# Patient Record
Sex: Male | Born: 1950 | Race: White | Hispanic: No | Smoking: Never smoker
Health system: Southern US, Community
[De-identification: ages and names within clinical notes are randomized; demographics above are authoritative.]

## PROBLEM LIST (undated history)

## (undated) DIAGNOSIS — K635 Polyp of colon: Secondary | ICD-10-CM

## (undated) DIAGNOSIS — F419 Anxiety disorder, unspecified: Secondary | ICD-10-CM

## (undated) DIAGNOSIS — R319 Hematuria, unspecified: Secondary | ICD-10-CM

## (undated) DIAGNOSIS — K769 Liver disease, unspecified: Secondary | ICD-10-CM

## (undated) DIAGNOSIS — F1020 Alcohol dependence, uncomplicated: Secondary | ICD-10-CM

## (undated) DIAGNOSIS — H7290 Unspecified perforation of tympanic membrane, unspecified ear: Secondary | ICD-10-CM

## (undated) DIAGNOSIS — K6389 Other specified diseases of intestine: Secondary | ICD-10-CM

## (undated) HISTORY — PX: COLONOSCOPY: SHX174

---

## 1998-11-05 ENCOUNTER — Encounter: Admission: RE | Admit: 1998-11-05 | Discharge: 1999-02-03 | Payer: Self-pay | Admitting: Anesthesiology

## 2015-08-06 DIAGNOSIS — Z13220 Encounter for screening for lipoid disorders: Secondary | ICD-10-CM | POA: Diagnosis not present

## 2015-08-06 DIAGNOSIS — F410 Panic disorder [episodic paroxysmal anxiety] without agoraphobia: Secondary | ICD-10-CM | POA: Diagnosis not present

## 2015-08-06 DIAGNOSIS — Z131 Encounter for screening for diabetes mellitus: Secondary | ICD-10-CM | POA: Diagnosis not present

## 2015-08-06 DIAGNOSIS — G479 Sleep disorder, unspecified: Secondary | ICD-10-CM | POA: Diagnosis not present

## 2015-08-06 DIAGNOSIS — N529 Male erectile dysfunction, unspecified: Secondary | ICD-10-CM | POA: Diagnosis not present

## 2016-01-16 DIAGNOSIS — F410 Panic disorder [episodic paroxysmal anxiety] without agoraphobia: Secondary | ICD-10-CM | POA: Diagnosis not present

## 2016-01-16 DIAGNOSIS — G479 Sleep disorder, unspecified: Secondary | ICD-10-CM | POA: Diagnosis not present

## 2016-01-16 DIAGNOSIS — Z1211 Encounter for screening for malignant neoplasm of colon: Secondary | ICD-10-CM | POA: Diagnosis not present

## 2016-07-17 DIAGNOSIS — M79643 Pain in unspecified hand: Secondary | ICD-10-CM | POA: Diagnosis not present

## 2016-07-17 DIAGNOSIS — G479 Sleep disorder, unspecified: Secondary | ICD-10-CM | POA: Diagnosis not present

## 2016-07-17 DIAGNOSIS — F419 Anxiety disorder, unspecified: Secondary | ICD-10-CM | POA: Diagnosis not present

## 2018-01-21 DIAGNOSIS — Z125 Encounter for screening for malignant neoplasm of prostate: Secondary | ICD-10-CM | POA: Diagnosis not present

## 2018-01-21 DIAGNOSIS — R972 Elevated prostate specific antigen [PSA]: Secondary | ICD-10-CM | POA: Diagnosis not present

## 2018-01-21 DIAGNOSIS — F410 Panic disorder [episodic paroxysmal anxiety] without agoraphobia: Secondary | ICD-10-CM | POA: Diagnosis not present

## 2018-01-21 DIAGNOSIS — G479 Sleep disorder, unspecified: Secondary | ICD-10-CM | POA: Diagnosis not present

## 2018-01-21 DIAGNOSIS — E785 Hyperlipidemia, unspecified: Secondary | ICD-10-CM | POA: Diagnosis not present

## 2018-01-21 DIAGNOSIS — Z79899 Other long term (current) drug therapy: Secondary | ICD-10-CM | POA: Diagnosis not present

## 2018-02-25 DIAGNOSIS — R972 Elevated prostate specific antigen [PSA]: Secondary | ICD-10-CM | POA: Diagnosis not present

## 2018-07-19 DIAGNOSIS — F411 Generalized anxiety disorder: Secondary | ICD-10-CM | POA: Diagnosis not present

## 2018-07-19 DIAGNOSIS — R972 Elevated prostate specific antigen [PSA]: Secondary | ICD-10-CM | POA: Diagnosis not present

## 2018-07-19 DIAGNOSIS — G479 Sleep disorder, unspecified: Secondary | ICD-10-CM | POA: Diagnosis not present

## 2018-07-19 DIAGNOSIS — Z0000 Encounter for general adult medical examination without abnormal findings: Secondary | ICD-10-CM | POA: Diagnosis not present

## 2018-07-19 DIAGNOSIS — Z1211 Encounter for screening for malignant neoplasm of colon: Secondary | ICD-10-CM | POA: Diagnosis not present

## 2018-07-19 DIAGNOSIS — H01002 Unspecified blepharitis right lower eyelid: Secondary | ICD-10-CM | POA: Diagnosis not present

## 2018-07-19 DIAGNOSIS — E785 Hyperlipidemia, unspecified: Secondary | ICD-10-CM | POA: Diagnosis not present

## 2018-07-26 DIAGNOSIS — Z0000 Encounter for general adult medical examination without abnormal findings: Secondary | ICD-10-CM | POA: Diagnosis not present

## 2018-07-26 DIAGNOSIS — E785 Hyperlipidemia, unspecified: Secondary | ICD-10-CM | POA: Diagnosis not present

## 2018-07-26 DIAGNOSIS — R972 Elevated prostate specific antigen [PSA]: Secondary | ICD-10-CM | POA: Diagnosis not present

## 2018-09-09 DIAGNOSIS — R361 Hematospermia: Secondary | ICD-10-CM | POA: Diagnosis not present

## 2018-09-09 DIAGNOSIS — R319 Hematuria, unspecified: Secondary | ICD-10-CM | POA: Diagnosis not present

## 2018-09-23 DIAGNOSIS — R319 Hematuria, unspecified: Secondary | ICD-10-CM | POA: Diagnosis not present

## 2018-10-03 DIAGNOSIS — R8289 Other abnormal findings on cytological and histological examination of urine: Secondary | ICD-10-CM | POA: Diagnosis not present

## 2018-10-03 DIAGNOSIS — R310 Gross hematuria: Secondary | ICD-10-CM | POA: Diagnosis not present

## 2018-10-03 DIAGNOSIS — R972 Elevated prostate specific antigen [PSA]: Secondary | ICD-10-CM | POA: Diagnosis not present

## 2018-10-04 DIAGNOSIS — L570 Actinic keratosis: Secondary | ICD-10-CM | POA: Diagnosis not present

## 2018-10-04 DIAGNOSIS — C4402 Squamous cell carcinoma of skin of lip: Secondary | ICD-10-CM | POA: Diagnosis not present

## 2018-10-04 DIAGNOSIS — C44319 Basal cell carcinoma of skin of other parts of face: Secondary | ICD-10-CM | POA: Diagnosis not present

## 2018-10-04 DIAGNOSIS — D485 Neoplasm of uncertain behavior of skin: Secondary | ICD-10-CM | POA: Diagnosis not present

## 2018-10-12 DIAGNOSIS — K769 Liver disease, unspecified: Secondary | ICD-10-CM | POA: Diagnosis not present

## 2018-10-12 DIAGNOSIS — K5730 Diverticulosis of large intestine without perforation or abscess without bleeding: Secondary | ICD-10-CM | POA: Diagnosis not present

## 2018-10-12 DIAGNOSIS — R932 Abnormal findings on diagnostic imaging of liver and biliary tract: Secondary | ICD-10-CM | POA: Diagnosis not present

## 2018-10-12 DIAGNOSIS — R319 Hematuria, unspecified: Secondary | ICD-10-CM | POA: Diagnosis not present

## 2018-10-12 DIAGNOSIS — K6389 Other specified diseases of intestine: Secondary | ICD-10-CM | POA: Diagnosis not present

## 2018-10-12 DIAGNOSIS — N400 Enlarged prostate without lower urinary tract symptoms: Secondary | ICD-10-CM | POA: Diagnosis not present

## 2018-10-12 DIAGNOSIS — N3289 Other specified disorders of bladder: Secondary | ICD-10-CM | POA: Diagnosis not present

## 2018-10-17 DIAGNOSIS — Z23 Encounter for immunization: Secondary | ICD-10-CM | POA: Diagnosis not present

## 2018-10-25 DIAGNOSIS — D124 Benign neoplasm of descending colon: Secondary | ICD-10-CM | POA: Diagnosis not present

## 2018-10-25 DIAGNOSIS — D122 Benign neoplasm of ascending colon: Secondary | ICD-10-CM | POA: Diagnosis not present

## 2018-10-25 DIAGNOSIS — D123 Benign neoplasm of transverse colon: Secondary | ICD-10-CM | POA: Diagnosis not present

## 2018-10-25 DIAGNOSIS — Z1211 Encounter for screening for malignant neoplasm of colon: Secondary | ICD-10-CM | POA: Diagnosis not present

## 2018-10-31 DIAGNOSIS — D2330 Other benign neoplasm of skin of unspecified part of face: Secondary | ICD-10-CM | POA: Diagnosis not present

## 2018-10-31 DIAGNOSIS — C44319 Basal cell carcinoma of skin of other parts of face: Secondary | ICD-10-CM | POA: Diagnosis not present

## 2018-11-01 DIAGNOSIS — Z1211 Encounter for screening for malignant neoplasm of colon: Secondary | ICD-10-CM | POA: Diagnosis not present

## 2018-11-01 DIAGNOSIS — D122 Benign neoplasm of ascending colon: Secondary | ICD-10-CM | POA: Diagnosis not present

## 2018-11-01 DIAGNOSIS — D123 Benign neoplasm of transverse colon: Secondary | ICD-10-CM | POA: Diagnosis not present

## 2018-11-01 DIAGNOSIS — D124 Benign neoplasm of descending colon: Secondary | ICD-10-CM | POA: Diagnosis not present

## 2018-11-07 ENCOUNTER — Other Ambulatory Visit: Payer: Self-pay | Admitting: Gastroenterology

## 2018-11-07 DIAGNOSIS — R935 Abnormal findings on diagnostic imaging of other abdominal regions, including retroperitoneum: Secondary | ICD-10-CM

## 2018-11-08 ENCOUNTER — Other Ambulatory Visit: Payer: Self-pay | Admitting: Gastroenterology

## 2018-11-08 DIAGNOSIS — K769 Liver disease, unspecified: Secondary | ICD-10-CM

## 2018-11-08 DIAGNOSIS — R935 Abnormal findings on diagnostic imaging of other abdominal regions, including retroperitoneum: Secondary | ICD-10-CM

## 2018-11-13 ENCOUNTER — Ambulatory Visit
Admission: RE | Admit: 2018-11-13 | Discharge: 2018-11-13 | Disposition: A | Discharge status: Home / Self Care | Payer: Medicare HMO | Source: Ambulatory Visit | Attending: Gastroenterology | Admitting: Gastroenterology

## 2018-11-13 DIAGNOSIS — K769 Liver disease, unspecified: Secondary | ICD-10-CM | POA: Diagnosis not present

## 2018-11-13 DIAGNOSIS — R935 Abnormal findings on diagnostic imaging of other abdominal regions, including retroperitoneum: Secondary | ICD-10-CM

## 2018-11-13 MED ORDER — GADOBENATE DIMEGLUMINE 529 MG/ML IV SOLN
20.0000 mL | Freq: Once | INTRAVENOUS | Status: AC | PRN
  Administered 2018-11-13: 20 mL via INTRAVENOUS

## 2018-11-17 DIAGNOSIS — C4402 Squamous cell carcinoma of skin of lip: Secondary | ICD-10-CM | POA: Diagnosis not present

## 2018-12-02 DIAGNOSIS — D123 Benign neoplasm of transverse colon: Secondary | ICD-10-CM | POA: Diagnosis not present

## 2018-12-02 DIAGNOSIS — R3129 Other microscopic hematuria: Secondary | ICD-10-CM | POA: Diagnosis not present

## 2018-12-06 ENCOUNTER — Other Ambulatory Visit: Payer: Self-pay | Admitting: Surgery

## 2018-12-15 ENCOUNTER — Other Ambulatory Visit: Payer: Self-pay

## 2018-12-15 ENCOUNTER — Encounter (HOSPITAL_COMMUNITY)
Admission: RE | Admit: 2018-12-15 | Discharge: 2018-12-15 | Disposition: A | Discharge status: Home / Self Care | Payer: Medicare HMO | Source: Ambulatory Visit | Attending: Surgery | Admitting: Surgery

## 2018-12-15 ENCOUNTER — Encounter (HOSPITAL_COMMUNITY): Payer: Self-pay

## 2018-12-15 DIAGNOSIS — Z01812 Encounter for preprocedural laboratory examination: Secondary | ICD-10-CM | POA: Diagnosis not present

## 2018-12-15 HISTORY — DX: Anxiety disorder, unspecified: F41.9

## 2018-12-15 HISTORY — DX: Liver disease, unspecified: K76.9

## 2018-12-15 HISTORY — DX: Polyp of colon: K63.5

## 2018-12-15 HISTORY — DX: Hematuria, unspecified: R31.9

## 2018-12-15 HISTORY — DX: Unspecified perforation of tympanic membrane, unspecified ear: H72.90

## 2018-12-15 HISTORY — DX: Other specified diseases of intestine: K63.89

## 2018-12-15 HISTORY — DX: Alcohol dependence, uncomplicated: F10.20

## 2018-12-15 LAB — COMPREHENSIVE METABOLIC PANEL
ALT: 10 U/L (ref 0–44)
AST: 12 U/L — ABNORMAL LOW (ref 15–41)
Albumin: 4.3 g/dL (ref 3.5–5.0)
Alkaline Phosphatase: 48 U/L (ref 38–126)
Anion gap: 8 (ref 5–15)
BUN: 15 mg/dL (ref 8–23)
CO2: 24 mmol/L (ref 22–32)
Calcium: 9.2 mg/dL (ref 8.9–10.3)
Chloride: 106 mmol/L (ref 98–111)
Creatinine, Ser: 0.96 mg/dL (ref 0.61–1.24)
GFR calc Af Amer: >60 mL/min (ref >60)
GFR calc non Af Amer: >60 mL/min (ref >60)
Glucose, Bld: 87 mg/dL (ref 70–99)
POTASSIUM: 4.2 mmol/L (ref 3.5–5.1)
Sodium: 138 mmol/L (ref 135–145)
TOTAL PROTEIN: 7.5 g/dL (ref 6.5–8.1)
Total Bilirubin: 0.7 mg/dL (ref 0.3–1.2)

## 2018-12-15 LAB — CBC WITH DIFFERENTIAL/PLATELET
ABS IMMATURE GRANULOCYTES: 0.02 10*3/uL (ref 0.00–0.07)
Basophils Absolute: 0 10*3/uL (ref 0.0–0.1)
Basophils Relative: 1 %
Eosinophils Absolute: 0.2 10*3/uL (ref 0.0–0.5)
Eosinophils Relative: 3 %
HCT: 44.6 % (ref 39.0–52.0)
HEMOGLOBIN: 14.7 g/dL (ref 13.0–17.0)
Immature Granulocytes: 0 %
Lymphocytes Relative: 37 %
Lymphs Abs: 2.1 10*3/uL (ref 0.7–4.0)
MCH: 31.2 pg (ref 26.0–34.0)
MCHC: 33 g/dL (ref 30.0–36.0)
MCV: 94.7 fL (ref 80.0–100.0)
Monocytes Absolute: 0.3 10*3/uL (ref 0.1–1.0)
Monocytes Relative: 6 %
NEUTROS ABS: 3 10*3/uL (ref 1.7–7.7)
Neutrophils Relative %: 53 %
Platelets: 219 10*3/uL (ref 150–400)
RBC: 4.71 MIL/uL (ref 4.22–5.81)
RDW: 12.7 % (ref 11.5–15.5)
WBC: 5.7 10*3/uL (ref 4.0–10.5)
nRBC: 0 % (ref 0.0–0.2)

## 2018-12-15 LAB — ABO/RH: ABO/RH(D): O NEG

## 2018-12-15 LAB — HEMOGLOBIN A1C
Hgb A1c MFr Bld: 5.3 % (ref 4.8–5.6)
Mean Plasma Glucose: 105.41 mg/dL

## 2018-12-15 NOTE — Patient Instructions (Signed)
Jack Yu  12/15/2018   Your procedure is scheduled on: 12-20-2018    Report to [facility] Main  Entrance     Report to admitting at 5:30AM    Call this number if you have problems the morning of surgery [phone]      Remember: [place]!   [place] 2 PRESURGERY ENSURE DRINKS THE NIGHT BEFORE SURGERY AT 10: 00 PM. NO SOLIDS AFTER MIDNIGHT THE DAY PRIOR TO THE SURGERY CONTINUE DRINKING CLEAR LIQUIDS UNTIL THREE HOURS PRIOR TO SCHEDULED SURGERY.  PLEASE DRINK LAST PRESURGERY ENSURE DRINK PER SURGEON ORDER AT ___4:30AM____.   BRUSH YOUR TEETH MORNING OF SURGERY AND RINSE YOUR MOUTH OUT, NO CHEWING GUM CANDY OR MINTS.      CLEAR LIQUID DIET   Foods Allowed                                                                     Foods Excluded  Coffee and tea, regular and decaf                             liquids that you cannot  Plain Jell-O in any flavor                                             see through such as: Fruit ices (not with fruit pulp)                                     milk, soups, orange juice  Iced Popsicles                                    All solid food Carbonated beverages, regular and diet                                    Cranberry, grape and apple juices Sports drinks like Gatorade Lightly seasoned clear broth or consume(fat free) Sugar, honey syrup  Sample Menu Breakfast                                Lunch                                     Supper Cranberry juice                    Beef broth                            Chicken broth Jell-O  Grape juice                           Apple juice Coffee or tea                        Jell-O                                      Popsicle                                                Coffee or tea                        Coffee or  tea  _____________________________________________________________________       Take these medicines the morning of surgery with A SIP OF WATER: XANAX IF NEEDED                                You may not have any metal on your body including hair pins and              piercings  Do not wear jewelry, make-up, lotions, powders or perfumes, deodorant                      Men may shave face and neck.   Do not bring valuables to the hospital. [place].  Contacts, dentures or bridgework may not be worn into surgery.  Leave suitcase in the car. After surgery it may be brought to your room.                   Please read over the following fact sheets you were given: _____________________________________________________________________             [facility] - Preparing for Surgery Before surgery, you can play an important role.  Because skin is not sterile, your skin needs to be as free of germs as possible.  You can reduce the number of germs on your skin by washing with CHG (chlorahexidine gluconate) soap before surgery.  CHG is an antiseptic cleaner which kills germs and bonds with the skin to continue killing germs even after washing. Please DO NOT use if you have an allergy to CHG or antibacterial soaps.  If your skin becomes reddened/irritated stop using the CHG and inform your nurse when you arrive at Short Stay. Do not shave (including legs and underarms) for at least 48 hours prior to the first CHG shower.  You may shave your face/neck. Please follow these instructions carefully:  1.  Shower with CHG Soap the night before surgery and the  morning of Surgery.  2.  If you choose to wash your hair, wash your hair first as usual with your  normal  shampoo.  3.  After you shampoo, rinse your hair and body thoroughly to remove the  shampoo.  4.  Use CHG as you would any other liquid soap.  You can apply chg  directly  to the skin and wash                       Gently with a scrungie or clean washcloth.  5.  Apply the CHG Soap to your body ONLY FROM THE NECK DOWN.   Do not use on face/ open                           Wound or open sores. Avoid contact with eyes, ears mouth and genitals (private parts).                       Wash face,  Genitals (private parts) with your normal soap.             6.  Wash thoroughly, paying special attention to the area where your surgery  will be performed.  7.  Thoroughly rinse your body with warm water from the neck down.  8.  DO NOT shower/wash with your normal soap after using and rinsing off  the CHG Soap.                9.  Pat yourself dry with a clean towel.            10.  Wear clean pajamas.            11.  Place clean sheets on your bed the night of your first shower and do not  sleep with pets. Day of Surgery : Do not apply any lotions/deodorants the morning of surgery.  Please wear clean clothes to the hospital/surgery center.  FAILURE TO FOLLOW THESE INSTRUCTIONS MAY RESULT IN THE CANCELLATION OF YOUR SURGERY PATIENT SIGNATURE_________________________________  NURSE SIGNATURE__________________________________  ________________________________________________________________________   Jack Yu  An incentive spirometer is a tool that can help keep your lungs clear and active. This tool measures how well you are filling your lungs with each breath. Taking long deep breaths may help reverse or decrease the chance of developing breathing (pulmonary) problems (especially infection) following:  A long period of time when you are unable to move or be active. BEFORE THE PROCEDURE   If the spirometer includes an indicator to show your best effort, your nurse or respiratory therapist will set it to a desired goal.  If possible, sit up straight or lean slightly forward. Try not to slouch.  Hold the incentive spirometer in an upright  position. INSTRUCTIONS FOR USE  1. Sit on the edge of your bed if possible, or sit up as far as you can in bed or on a chair. 2. Hold the incentive spirometer in an upright position. 3. Breathe out normally. 4. Place the mouthpiece in your mouth and seal your lips tightly around it. 5. Breathe in slowly and as deeply as possible, raising the piston or the ball toward the top of the column. 6. Hold your breath for 3-5 seconds or for as long as possible. Allow the piston or ball to fall to the bottom of the column. 7. Remove the mouthpiece from your mouth and breathe out normally. 8. Rest for a few seconds and repeat Steps 1 through 7 at least 10 times every 1-2 hours when you are awake. Take your time and take a few normal breaths between deep breaths. 9. The spirometer may include an indicator to  show your best effort. Use the indicator as a goal to work toward during each repetition. 10. After each set of 10 deep breaths, practice coughing to be sure your lungs are clear. If you have an incision (the cut made at the time of surgery), support your incision when coughing by placing a pillow or rolled up towels firmly against it. Once you are able to get out of bed, walk around indoors and cough well. You may stop using the incentive spirometer when instructed by your caregiver.  RISKS AND COMPLICATIONS  Take your time so you do not get dizzy or light-headed.  If you are in pain, you may need to take or ask for pain medication before doing incentive spirometry. It is harder to take a deep breath if you are having pain. AFTER USE  Rest and breathe slowly and easily.  It can be helpful to keep track of a log of your progress. Your caregiver can provide you with a simple table to help with this. If you are using the spirometer at home, follow these instructions: [place] IF:   You are having difficultly using the spirometer.  You have trouble using the spirometer as often as  instructed.  Your pain medication is not giving enough relief while using the spirometer.  You develop fever of 100.5 F (38.1 C) or higher. SEEK IMMEDIATE MEDICAL CARE IF:   You cough up bloody sputum that had not been present before.  You develop fever of 102 F (38.9 C) or greater.  You develop worsening pain at or near the incision site. MAKE SURE YOU:   Understand these instructions.  Will watch your condition.  Will get help right away if you are not doing well or get worse. Document Released: 03/29/2007 Document Revised: 02/08/2012 Document Reviewed: 05/30/2007 [facility] Patient Information 2014 [place], [place].   ________________________________________________________________________

## 2018-12-15 NOTE — Consult Note (Signed)
[place] Nurse requested for preoperative stoma site marking  Discussed surgical procedure and stoma creation with patient and family.  Explained role of the [place] nurse team.  Provided the patient with educational booklet and provided samples of pouching options.  Answered patient and family questions.   Examined patient sitting, and standing in order to place the marking in the patient's visual field, away from any creases or abdominal contour issues and within the rectus muscle.  Attempted to mark below the patient's belt line.   Marked for colostomy in the LLQ  4 cm to the left of the umbilicus and 3 cm below the umbilicus.   Patient's abdomen cleansed with CHG wipes at site markings, allowed to air dry prior to marking.Covered mark with thin film transparent dressing to preserve mark until date of surgery.   [place] Nurse team will follow up with patient after surgery for continue ostomy care and teaching.   Domenic Moras MSN, RN, FNP-BC CWON Wound, Ostomy, Continence Nurse Pager [phone]

## 2018-12-16 LAB — CEA: CEA: 1.8 ng/mL (ref 0.0–4.7)

## 2018-12-19 MED ORDER — BUPIVACAINE LIPOSOME 1.3 % IJ SUSP
20.0000 mL | Freq: Once | INTRAMUSCULAR | Status: DC
  Filled 2018-12-19: qty 20

## 2018-12-19 NOTE — Anesthesia Preprocedure Evaluation (Addendum)
Anesthesia Evaluation  Patient identified by MRN, date of birth, ID band Patient awake    Reviewed: Allergy & Precautions, H&P , NPO status , Patient's Chart, lab work & pertinent test results, reviewed documented beta blocker date and time   Airway Mallampati: II  TM Distance: >3 FB Neck ROM: full    Dental no notable dental hx.    Pulmonary neg pulmonary ROS,    Pulmonary exam normal breath sounds clear to auscultation       Cardiovascular Exercise Tolerance: Good negative cardio ROS   Rhythm:regular Rate:Normal     Neuro/Psych negative neurological ROS  negative psych ROS   GI/Hepatic negative GI ROS, Neg liver ROS,   Endo/Other  negative endocrine ROS  Renal/GU negative Renal ROS  negative genitourinary   Musculoskeletal   Abdominal   Peds  Hematology negative hematology ROS (+)   Anesthesia Other Findings   Reproductive/Obstetrics negative OB ROS                            Anesthesia Physical Anesthesia Plan  ASA: II  Anesthesia Plan: General   Post-op Pain Management:    Induction: Intravenous  PONV Risk Score and Plan: 2 and Ondansetron and Treatment may vary due to age or medical condition  Airway Management Planned: Oral ETT  Additional Equipment:   Intra-op Plan:   Post-operative Plan: Extubation in OR  Informed Consent: I have reviewed the patients History and Physical, chart, labs and discussed the procedure including the risks, benefits and alternatives for the proposed anesthesia with the patient or authorized representative who has indicated his/her understanding and acceptance.     Dental Advisory Given  Plan Discussed with: CRNA, Anesthesiologist and Surgeon  Anesthesia Plan Comments: (  )       Anesthesia Quick Evaluation

## 2018-12-19 NOTE — H&P (Signed)
Jack Yu Documented: 12/02/2018 9:31 AM Location: [facility] Patient #: [id number] DOB: [birth year] Single / Language: Cleophus Molt / Race: White Male   History of Present Illness   The patient is a 68 year old male who presents with a complaint of colon mass.  The PCP is Dr. Charlynne Cousins  The patient was referred by Dr. Alycia Rossetti. Dr. Oletta Lamas spoke to me about this patient in the middle of December 2019.  He comes with his neice, Velna Hatchet (who used to work for [place] - left about 6 years ago)  The patient is not exactly clear for his timeline. It seems like this all started when he noted blood in his urine. He had a CT scan at [facility] on 12 October 2018. This showed 1. Decompressed urinary bladder with mild thickening, 2. Diverticulosis without diverticulitis, 3. Asymmetric prominent masslike appearance of the mid transverse colon, 4. Probable small hemangioma in the left hepatic lobe. He underwent a colonoscopy by Dr. Alycia Rossetti on 10/25/2018 - who found three 5 - 10 mm polyps of the right colon, 4 5 - 15 mm polyps of the transverse colon, tumor mid transverse colon (tatooed), one 15 mm polyp of the descending colon. 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 He underwent an abdominal MRI on 11/13/2018 which showed 1. Small lesion in the medial segment of the left hepatic lobe demonstrates signal and enhancement characteristics most consistent with an incidental flash filling hemangioma. No suspicious hepatic lesions. 2. Mass lesion of the transverse colon (4.3 cm) as reported on outside CT, remaining worrisome for colon cancer. Colonoscopy recommended. 3. No evidence of metastatic disease on this examination   I reviewed with the patient  the findings and need for colon surgery. I discussed both surgical and non surgical options. I discussed the role of laparoscopic and open surgery in colon surgery. I reviewed the risks of surgery, including, but not limited to, infection, bleeding, nerve injury, anastomotic leaks, and possibility of colostomy. I went over the preoperative mechanical and antibiotic bowel prep for colon surgery and provided prescriptions for these.  I provided the patient literature on colon surgery. I tried to answer all questions for the patient.  I think the biggest question is the extent of colon surgery required.  [I spoke to Dr. Oletta Lamas - it looks like an extended right colectomy (that includes the main tumor) will take care of all suspicious polyps. Of course, he would need a colonoscopy in 1 year]  Plan: 1) Extended right colectomy, 3) mechanical and anitbiotic bowel prep given  Review of Systems as stated in this history (HPI) or in the review of systems. Otherwise all other 12 point ROS are negative  Past Medical History: 1. Hematuria Being evaluated at [place], [place]  Social History: Divorced. He has one son, 26 yo, who lives in [place]. 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  He comes with his neice, Sondra Barges (?) (who used to work for [place] - left about 6 years ago). She nows works at the jail [place].   Past Surgical History (April Staton, [place]; 12/02/2018 9:31 AM) Colon Polyp Removal - Colonoscopy   Diagnostic Studies History (April Staton, [place]; 12/02/2018 9:31 AM) Colonoscopy  within last year  Allergies (April Staton, CMA; 12/02/2018 9:32 AM) No  Known Drug Allergies [12/02/2018]:  Medication History (April Staton, CMA; 12/02/2018 9:33 AM) Temazepam (15MG  Capsule, Oral) Active. ALPRAZolam (1MG  Tablet, Oral) Active. Medications Reconciled  Social History (April Staton, [place]; 12/02/2018 9:31 AM) Alcohol use  Moderate  alcohol use. No caffeine use  No drug use  Tobacco use  Never smoker.  Family History (April Staton, [place]; 12/02/2018 9:31 AM) Alcohol Abuse  Brother, Father. Arthritis  Father, Mother. Breast Cancer  Family Members In General. Cancer  Brother. Colon Polyps  Father, Sister. Heart Disease  Father, Mother. Melanoma  Family Members In General.  Other Problems (April Staton, [place]; 12/02/2018 9:31 AM) Alcohol Abuse  Anxiety Disorder  Arthritis  Enlarged Prostate  Melanoma     Review of Systems (April Staton CMA; 12/02/2018 9:31 AM) General Not Present- Appetite Loss, Chills, Fatigue, Fever, Night Sweats, Weight Gain and Weight Loss. Skin Not Present- Change in Wart/Mole, Dryness, Hives, Jaundice, New Lesions, Non-Healing Wounds, Rash and Ulcer. HEENT Present- Seasonal Allergies. Not Present- Earache, Hearing Loss, Hoarseness, Nose Bleed, Oral Ulcers, Ringing in the Ears, Sinus Pain, Sore Throat, Visual Disturbances, Wears glasses/contact lenses and Yellow Eyes. Respiratory Present- Snoring. Not Present- Bloody sputum, Chronic Cough, Difficulty Breathing and Wheezing. Cardiovascular Not Present- Chest Pain, Difficulty Breathing Lying Down, Leg Cramps, Palpitations, Rapid Heart Rate, Shortness of Breath and Swelling of Extremities. Gastrointestinal Not Present- Abdominal Pain, Bloating, Bloody Stool, Change in Bowel Habits, Chronic diarrhea, Constipation, Difficulty Swallowing, Excessive gas, Gets full quickly at meals, Hemorrhoids, Indigestion, Nausea, Rectal Pain and Vomiting. Male Genitourinary Present- Blood in Urine. Not Present- Change in Urinary Stream, Frequency, Impotence, Nocturia, Painful Urination, Urgency and Urine Leakage. Musculoskeletal Not Present- Back Pain, Joint Pain, Joint Stiffness, Muscle Pain, Muscle Weakness and Swelling of Extremities. Neurological Present- Tremor. Not Present- Decreased Memory, Fainting, Headaches, Numbness, Seizures, Tingling, Trouble  walking and Weakness. Psychiatric Present- Anxiety. Not Present- Bipolar, Change in Sleep Pattern, Depression, Fearful and Frequent crying. Endocrine Not Present- Cold Intolerance, Excessive Hunger, Hair Changes, Heat Intolerance, Hot flashes and New Diabetes. Hematology Not Present- Blood Thinners, Easy Bruising, Excessive bleeding, Gland problems, HIV and Persistent Infections.  Vitals (April Staton CMA; 12/02/2018 9:33 AM) 12/02/2018 9:33 AM Weight: 218 lb Height: 74in Body Surface Area: 2.25 m Body Mass Index: 27.99 kg/m  Temp.: 97.52F(Oral)  Pulse: 59 (Regular)  BP: 128/84 (Sitting, Left Arm, Standard)   Physical Exam  General: WN WM who is alert and generally healthy appearing. He is somewhat nervous. Skin: Inspection and palpation of the skin unremarkable.  Eyes: Conjunctivae white, pupils equal. Face, ears, nose, mouth, and throat: Face - normal. Normal ears and nose. Lips and teeth normal.  Neck: Supple. No mass. Trachea midline. No thyroid mass.  Lymph Nodes: No supraclavicular or cervical adenopathy. No axillary adenopathy.  Lungs: Normal respiratory effort. Clear to auscultation and symmetric breath sounds. Cardiovascular: Regular rate and rythm. Normal auscultation of the heart. No murmur or rub.  Abdomen: Soft. No mass. Liver and spleen not palpable. No tenderness. No hernia. Normal bowel sounds. No abdominal scars. Rectal: Not done.  Musculoskeletal/extremities: Normal gait. Good strength and ROM in upper and lower extremities.   Neurologic: Grossly intact to motor and sensory function.   Psychiatric: Has normal mood and affect. Judgement and insight appear normal.   Assessment & Plan  1.  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  HEMATURIA, MICROSCOPIC  (R31.29)  Impression: Being evaluated in  Spero Geralds, MD, [facility] Surgery Pager: [phone] Office phone:  [phone]

## 2018-12-20 ENCOUNTER — Encounter (HOSPITAL_COMMUNITY): Payer: Self-pay

## 2018-12-20 ENCOUNTER — Inpatient Hospital Stay (HOSPITAL_COMMUNITY): Payer: Medicare HMO | Admitting: Anesthesiology

## 2018-12-20 ENCOUNTER — Inpatient Hospital Stay (HOSPITAL_COMMUNITY): Payer: Medicare HMO | Admitting: Physician Assistant

## 2018-12-20 ENCOUNTER — Other Ambulatory Visit: Payer: Self-pay

## 2018-12-20 ENCOUNTER — Encounter (HOSPITAL_COMMUNITY)
Admission: RE | Disposition: A | Discharge status: Home / Self Care | Payer: Self-pay | Source: Home / Self Care | Attending: Surgery

## 2018-12-20 ENCOUNTER — Inpatient Hospital Stay (HOSPITAL_COMMUNITY)
Admission: RE | Admit: 2018-12-20 | Discharge: 2019-01-02 | DRG: 330 | Disposition: A | Discharge status: Home / Self Care | Payer: Medicare HMO | Attending: Surgery | Admitting: Surgery

## 2018-12-20 DIAGNOSIS — K567 Ileus, unspecified: Secondary | ICD-10-CM | POA: Diagnosis not present

## 2018-12-20 DIAGNOSIS — R251 Tremor, unspecified: Secondary | ICD-10-CM | POA: Diagnosis present

## 2018-12-20 DIAGNOSIS — Z79899 Other long term (current) drug therapy: Secondary | ICD-10-CM | POA: Diagnosis not present

## 2018-12-20 DIAGNOSIS — Z808 Family history of malignant neoplasm of other organs or systems: Secondary | ICD-10-CM

## 2018-12-20 DIAGNOSIS — D123 Benign neoplasm of transverse colon: Principal | ICD-10-CM | POA: Diagnosis present

## 2018-12-20 DIAGNOSIS — E440 Moderate protein-calorie malnutrition: Secondary | ICD-10-CM | POA: Diagnosis present

## 2018-12-20 DIAGNOSIS — Z85820 Personal history of malignant melanoma of skin: Secondary | ICD-10-CM

## 2018-12-20 DIAGNOSIS — K56609 Unspecified intestinal obstruction, unspecified as to partial versus complete obstruction: Secondary | ICD-10-CM | POA: Diagnosis not present

## 2018-12-20 DIAGNOSIS — Z8261 Family history of arthritis: Secondary | ICD-10-CM | POA: Diagnosis not present

## 2018-12-20 DIAGNOSIS — M1990 Unspecified osteoarthritis, unspecified site: Secondary | ICD-10-CM | POA: Diagnosis present

## 2018-12-20 DIAGNOSIS — Z6825 Body mass index (BMI) 25.0-25.9, adult: Secondary | ICD-10-CM | POA: Diagnosis not present

## 2018-12-20 DIAGNOSIS — F419 Anxiety disorder, unspecified: Secondary | ICD-10-CM | POA: Diagnosis present

## 2018-12-20 DIAGNOSIS — K9189 Other postprocedural complications and disorders of digestive system: Secondary | ICD-10-CM

## 2018-12-20 DIAGNOSIS — D1809 Hemangioma of other sites: Secondary | ICD-10-CM | POA: Diagnosis not present

## 2018-12-20 DIAGNOSIS — R05 Cough: Secondary | ICD-10-CM | POA: Diagnosis not present

## 2018-12-20 DIAGNOSIS — K635 Polyp of colon: Secondary | ICD-10-CM | POA: Diagnosis not present

## 2018-12-20 DIAGNOSIS — N400 Enlarged prostate without lower urinary tract symptoms: Secondary | ICD-10-CM | POA: Diagnosis not present

## 2018-12-20 DIAGNOSIS — R3129 Other microscopic hematuria: Secondary | ICD-10-CM | POA: Diagnosis not present

## 2018-12-20 DIAGNOSIS — D490 Neoplasm of unspecified behavior of digestive system: Secondary | ICD-10-CM | POA: Diagnosis not present

## 2018-12-20 DIAGNOSIS — R0602 Shortness of breath: Secondary | ICD-10-CM

## 2018-12-20 DIAGNOSIS — R140 Abdominal distension (gaseous): Secondary | ICD-10-CM | POA: Diagnosis not present

## 2018-12-20 DIAGNOSIS — Z4682 Encounter for fitting and adjustment of non-vascular catheter: Secondary | ICD-10-CM | POA: Diagnosis not present

## 2018-12-20 DIAGNOSIS — F1020 Alcohol dependence, uncomplicated: Secondary | ICD-10-CM | POA: Diagnosis not present

## 2018-12-20 DIAGNOSIS — J302 Other seasonal allergic rhinitis: Secondary | ICD-10-CM | POA: Diagnosis present

## 2018-12-20 DIAGNOSIS — D122 Benign neoplasm of ascending colon: Secondary | ICD-10-CM | POA: Diagnosis not present

## 2018-12-20 DIAGNOSIS — R061 Stridor: Secondary | ICD-10-CM

## 2018-12-20 DIAGNOSIS — Z4659 Encounter for fitting and adjustment of other gastrointestinal appliance and device: Secondary | ICD-10-CM

## 2018-12-20 HISTORY — PX: LAPAROSCOPIC RIGHT COLECTOMY: SHX5925

## 2018-12-20 LAB — TYPE AND SCREEN
ABO/RH(D): O NEG
Antibody Screen: NEGATIVE

## 2018-12-20 SURGERY — COLECTOMY, RIGHT, LAPAROSCOPIC
Anesthesia: General | Laterality: Right

## 2018-12-20 MED ORDER — ACETAMINOPHEN 500 MG PO TABS
1000.0000 mg | ORAL_TABLET | Freq: Three times a day (TID) | ORAL | Status: DC
  Administered 2018-12-20 – 2018-12-26 (×12): 1000 mg via ORAL
  Filled 2018-12-20 (×17): qty 2

## 2018-12-20 MED ORDER — HEPARIN SODIUM (PORCINE) 5000 UNIT/ML IJ SOLN
5000.0000 [IU] | Freq: Once | INTRAMUSCULAR | Status: AC
  Administered 2018-12-20: 5000 [IU] via SUBCUTANEOUS
  Filled 2018-12-20: qty 1

## 2018-12-20 MED ORDER — BUPIVACAINE-EPINEPHRINE (PF) 0.25% -1:200000 IJ SOLN
INTRAMUSCULAR | Status: AC
  Filled 2018-12-20: qty 30

## 2018-12-20 MED ORDER — KCL IN DEXTROSE-NACL 20-5-0.45 MEQ/L-%-% IV SOLN
INTRAVENOUS | Status: DC
  Administered 2018-12-20 – 2018-12-24 (×9): via INTRAVENOUS
  Filled 2018-12-20 (×10): qty 1000

## 2018-12-20 MED ORDER — TRAMADOL HCL 50 MG PO TABS
50.0000 mg | ORAL_TABLET | Freq: Four times a day (QID) | ORAL | Status: DC | PRN
  Administered 2018-12-21: 50 mg via ORAL
  Filled 2018-12-20: qty 1

## 2018-12-20 MED ORDER — ROCURONIUM BROMIDE 100 MG/10ML IV SOLN
INTRAVENOUS | Status: DC | PRN
  Administered 2018-12-20: 20 mg via INTRAVENOUS
  Administered 2018-12-20: 10 mg via INTRAVENOUS
  Administered 2018-12-20: 50 mg via INTRAVENOUS
  Administered 2018-12-20: 20 mg via INTRAVENOUS

## 2018-12-20 MED ORDER — FENTANYL CITRATE (PF) 250 MCG/5ML IJ SOLN
INTRAMUSCULAR | Status: DC | PRN
  Administered 2018-12-20 (×4): 50 ug via INTRAVENOUS
  Administered 2018-12-20: 100 ug via INTRAVENOUS
  Administered 2018-12-20: 50 ug via INTRAVENOUS

## 2018-12-20 MED ORDER — FENTANYL CITRATE (PF) 100 MCG/2ML IJ SOLN
INTRAMUSCULAR | Status: AC
  Administered 2018-12-20: 25 ug via INTRAVENOUS
  Filled 2018-12-20: qty 2

## 2018-12-20 MED ORDER — MORPHINE SULFATE (PF) 2 MG/ML IV SOLN
1.0000 mg | INTRAVENOUS | Status: DC | PRN
  Administered 2018-12-20: 2 mg via INTRAVENOUS
  Administered 2018-12-20: 3 mg via INTRAVENOUS
  Administered 2018-12-20 – 2018-12-24 (×15): 2 mg via INTRAVENOUS
  Administered 2018-12-25: 3 mg via INTRAVENOUS
  Filled 2018-12-20 (×5): qty 1
  Filled 2018-12-20: qty 2
  Filled 2018-12-20 (×14): qty 1

## 2018-12-20 MED ORDER — SUGAMMADEX SODIUM 200 MG/2ML IV SOLN
INTRAVENOUS | Status: DC | PRN
  Administered 2018-12-20: 200 mg via INTRAVENOUS

## 2018-12-20 MED ORDER — BUPIVACAINE LIPOSOME 1.3 % IJ SUSP
INTRAMUSCULAR | Status: DC | PRN
  Administered 2018-12-20: 20 mL

## 2018-12-20 MED ORDER — ONDANSETRON HCL 4 MG/2ML IJ SOLN
4.0000 mg | Freq: Four times a day (QID) | INTRAMUSCULAR | Status: DC | PRN
  Administered 2018-12-21: 4 mg via INTRAVENOUS
  Filled 2018-12-20: qty 2

## 2018-12-20 MED ORDER — LACTATED RINGERS IV SOLN
INTRAVENOUS | Status: DC
  Administered 2018-12-20 (×2): via INTRAVENOUS

## 2018-12-20 MED ORDER — LIDOCAINE 2% (20 MG/ML) 5 ML SYRINGE
INTRAMUSCULAR | Status: AC
  Filled 2018-12-20: qty 5

## 2018-12-20 MED ORDER — DEXAMETHASONE SODIUM PHOSPHATE 10 MG/ML IJ SOLN
INTRAMUSCULAR | Status: DC | PRN
  Administered 2018-12-20: 10 mg via INTRAVENOUS

## 2018-12-20 MED ORDER — HYDROCODONE-ACETAMINOPHEN 5-325 MG PO TABS
1.0000 | ORAL_TABLET | ORAL | Status: DC | PRN
  Administered 2018-12-21: 2 via ORAL
  Filled 2018-12-20 (×2): qty 2

## 2018-12-20 MED ORDER — PROPOFOL 10 MG/ML IV BOLUS
INTRAVENOUS | Status: AC
  Filled 2018-12-20: qty 40

## 2018-12-20 MED ORDER — EPHEDRINE SULFATE-NACL 50-0.9 MG/10ML-% IV SOSY
PREFILLED_SYRINGE | INTRAVENOUS | Status: DC | PRN
  Administered 2018-12-20 (×2): 10 mg via INTRAVENOUS

## 2018-12-20 MED ORDER — ALVIMOPAN 12 MG PO CAPS
12.0000 mg | ORAL_CAPSULE | ORAL | Status: AC
  Administered 2018-12-20: 12 mg via ORAL
  Filled 2018-12-20: qty 1

## 2018-12-20 MED ORDER — ALVIMOPAN 12 MG PO CAPS
12.0000 mg | ORAL_CAPSULE | Freq: Two times a day (BID) | ORAL | Status: DC
  Administered 2018-12-21: 12 mg via ORAL
  Filled 2018-12-20 (×2): qty 1

## 2018-12-20 MED ORDER — ACETAMINOPHEN 160 MG/5ML PO SOLN: 325.0000 mg | ORAL | Status: DC | PRN

## 2018-12-20 MED ORDER — DEXAMETHASONE SODIUM PHOSPHATE 10 MG/ML IJ SOLN
INTRAMUSCULAR | Status: AC
  Filled 2018-12-20: qty 1

## 2018-12-20 MED ORDER — ENSURE SURGERY PO LIQD
237.0000 mL | Freq: Two times a day (BID) | ORAL | Status: DC
  Administered 2018-12-21 – 2018-12-23 (×2): 237 mL via ORAL
  Filled 2018-12-20 (×15): qty 237

## 2018-12-20 MED ORDER — FENTANYL CITRATE (PF) 100 MCG/2ML IJ SOLN
25.0000 ug | INTRAMUSCULAR | Status: DC | PRN
  Administered 2018-12-20 (×2): 25 ug via INTRAVENOUS

## 2018-12-20 MED ORDER — CHLORHEXIDINE GLUCONATE 4 % EX LIQD: 60.0000 mL | Freq: Once | CUTANEOUS | Status: DC

## 2018-12-20 MED ORDER — MIDAZOLAM HCL 2 MG/2ML IJ SOLN
INTRAMUSCULAR | Status: AC
  Filled 2018-12-20: qty 2

## 2018-12-20 MED ORDER — FENTANYL CITRATE (PF) 250 MCG/5ML IJ SOLN
INTRAMUSCULAR | Status: AC
  Filled 2018-12-20: qty 5

## 2018-12-20 MED ORDER — OXYCODONE HCL 5 MG/5ML PO SOLN
5.0000 mg | Freq: Once | ORAL | Status: DC | PRN
  Filled 2018-12-20: qty 5

## 2018-12-20 MED ORDER — BUPIVACAINE-EPINEPHRINE 0.25% -1:200000 IJ SOLN
INTRAMUSCULAR | Status: DC | PRN
  Administered 2018-12-20: 30 mL

## 2018-12-20 MED ORDER — ONDANSETRON HCL 4 MG/2ML IJ SOLN: 4.0000 mg | Freq: Once | INTRAMUSCULAR | Status: DC | PRN

## 2018-12-20 MED ORDER — OXYCODONE HCL 5 MG PO TABS: 5.0000 mg | ORAL_TABLET | Freq: Once | ORAL | Status: DC | PRN

## 2018-12-20 MED ORDER — SODIUM CHLORIDE 0.9 % IV SOLN
2.0000 g | INTRAVENOUS | Status: AC
  Administered 2018-12-20: 2 g via INTRAVENOUS
  Filled 2018-12-20: qty 2

## 2018-12-20 MED ORDER — GABAPENTIN 300 MG PO CAPS
300.0000 mg | ORAL_CAPSULE | ORAL | Status: AC
  Administered 2018-12-20: 300 mg via ORAL
  Filled 2018-12-20: qty 1

## 2018-12-20 MED ORDER — ONDANSETRON HCL 4 MG/2ML IJ SOLN
INTRAMUSCULAR | Status: AC
  Filled 2018-12-20: qty 2

## 2018-12-20 MED ORDER — PROPOFOL 10 MG/ML IV BOLUS
INTRAVENOUS | Status: DC | PRN
  Administered 2018-12-20: 150 mg via INTRAVENOUS

## 2018-12-20 MED ORDER — ONDANSETRON HCL 4 MG/2ML IJ SOLN
INTRAMUSCULAR | Status: DC | PRN
  Administered 2018-12-20: 4 mg via INTRAVENOUS

## 2018-12-20 MED ORDER — KETAMINE HCL 100 MG/ML IJ SOLN
INTRAMUSCULAR | Status: DC | PRN
  Administered 2018-12-20 (×2): 20 mg via INTRAVENOUS

## 2018-12-20 MED ORDER — 0.9 % SODIUM CHLORIDE (POUR BTL) OPTIME
TOPICAL | Status: DC | PRN
  Administered 2018-12-20: 2000 mL

## 2018-12-20 MED ORDER — KETAMINE HCL 10 MG/ML IJ SOLN
INTRAMUSCULAR | Status: AC
  Filled 2018-12-20: qty 1

## 2018-12-20 MED ORDER — LIDOCAINE 20MG/ML (2%) 15 ML SYRINGE OPTIME
INTRAMUSCULAR | Status: DC | PRN
  Administered 2018-12-20: 60 mg via INTRAVENOUS

## 2018-12-20 MED ORDER — ENOXAPARIN SODIUM 40 MG/0.4ML ~~LOC~~ SOLN
40.0000 mg | SUBCUTANEOUS | Status: DC
  Administered 2018-12-21 – 2019-01-01 (×12): 40 mg via SUBCUTANEOUS
  Filled 2018-12-20 (×13): qty 0.4

## 2018-12-20 MED ORDER — MEPERIDINE HCL 50 MG/ML IJ SOLN: 6.2500 mg | INTRAMUSCULAR | Status: DC | PRN

## 2018-12-20 MED ORDER — ONDANSETRON HCL 4 MG PO TABS
4.0000 mg | ORAL_TABLET | Freq: Four times a day (QID) | ORAL | Status: DC | PRN
  Filled 2018-12-20: qty 1

## 2018-12-20 MED ORDER — ROCURONIUM BROMIDE 100 MG/10ML IV SOLN
INTRAVENOUS | Status: AC
  Filled 2018-12-20: qty 1

## 2018-12-20 MED ORDER — LIDOCAINE HCL (CARDIAC) PF 100 MG/5ML IV SOSY
PREFILLED_SYRINGE | INTRAVENOUS | Status: DC | PRN
  Administered 2018-12-20 (×2): 123 mg via INTRAVENOUS

## 2018-12-20 MED ORDER — MIDAZOLAM HCL 2 MG/2ML IJ SOLN
INTRAMUSCULAR | Status: DC | PRN
  Administered 2018-12-20: 2 mg via INTRAVENOUS

## 2018-12-20 MED ORDER — LACTATED RINGERS IR SOLN
Status: DC | PRN
  Administered 2018-12-20: 1000 mL

## 2018-12-20 MED ORDER — ACETAMINOPHEN 325 MG PO TABS: 325.0000 mg | ORAL_TABLET | ORAL | Status: DC | PRN

## 2018-12-20 MED ORDER — ACETAMINOPHEN 500 MG PO TABS
1000.0000 mg | ORAL_TABLET | ORAL | Status: AC
  Administered 2018-12-20: 1000 mg via ORAL
  Filled 2018-12-20: qty 2

## 2018-12-20 SURGICAL SUPPLY — 77 items
APPLIER CLIP 5 13 M/L LIGAMAX5 (MISCELLANEOUS)
APPLIER CLIP ROT 10 11.4 M/L (STAPLE)
BLADE EXTENDED COATED 6.5IN (ELECTRODE) IMPLANT
BLADE HEX COATED 2.75 (ELECTRODE) IMPLANT
CABLE HIGH FREQUENCY MONO STRZ (ELECTRODE) ×3 IMPLANT
CELLS DAT CNTRL 66122 CELL SVR (MISCELLANEOUS) IMPLANT
CLIP APPLIE 5 13 M/L LIGAMAX5 (MISCELLANEOUS) IMPLANT
CLIP APPLIE ROT 10 11.4 M/L (STAPLE) IMPLANT
COVER SURGICAL LIGHT HANDLE (MISCELLANEOUS) ×6 IMPLANT
COVER WAND RF STERILE (DRAPES) IMPLANT
DERMABOND ADVANCED (GAUZE/BANDAGES/DRESSINGS) ×2
DERMABOND ADVANCED .7 DNX12 (GAUZE/BANDAGES/DRESSINGS) ×1 IMPLANT
DISSECTOR BLUNT TIP ENDO 5MM (MISCELLANEOUS) IMPLANT
DRAIN CHANNEL 19F RND (DRAIN) IMPLANT
DRAPE UTILITY XL STRL (DRAPES) ×3 IMPLANT
DRSG OPSITE POSTOP 4X10 (GAUZE/BANDAGES/DRESSINGS) IMPLANT
DRSG OPSITE POSTOP 4X6 (GAUZE/BANDAGES/DRESSINGS) IMPLANT
DRSG OPSITE POSTOP 4X8 (GAUZE/BANDAGES/DRESSINGS) ×3 IMPLANT
ELECT PENCIL ROCKER SW 15FT (MISCELLANEOUS) ×3 IMPLANT
ELECT REM PT RETURN 15FT ADLT (MISCELLANEOUS) ×3 IMPLANT
EVACUATOR SILICONE 100CC (DRAIN) IMPLANT
GAUZE SPONGE 4X4 12PLY STRL (GAUZE/BANDAGES/DRESSINGS) IMPLANT
GLOVE BIOGEL M 8.0 STRL (GLOVE) ×6 IMPLANT
GLOVE BIOGEL PI IND STRL 6 (GLOVE) ×2 IMPLANT
GLOVE BIOGEL PI IND STRL 6.5 (GLOVE) ×2 IMPLANT
GLOVE BIOGEL PI INDICATOR 6 (GLOVE) ×4
GLOVE BIOGEL PI INDICATOR 6.5 (GLOVE) ×4
GLOVE ECLIPSE 6.5 STRL STRAW (GLOVE) ×6 IMPLANT
GLOVE SURG SIGNA 7.5 PF LTX (GLOVE) ×6 IMPLANT
GLOVE SURG SS PI 6.0 STRL IVOR (GLOVE) ×6 IMPLANT
GLOVE SURG SS PI 7.0 STRL IVOR (GLOVE) ×3 IMPLANT
GOWN STRL REUS W/ TWL LRG LVL3 (GOWN DISPOSABLE) ×2 IMPLANT
GOWN STRL REUS W/TWL LRG LVL3 (GOWN DISPOSABLE) ×4
GOWN STRL REUS W/TWL XL LVL3 (GOWN DISPOSABLE) ×18 IMPLANT
HOLDER FOLEY CATH W/STRAP (MISCELLANEOUS) ×3 IMPLANT
NEEDLE HYPO 25X1 1.5 SAFETY (NEEDLE) ×3 IMPLANT
PACK COLON (CUSTOM PROCEDURE TRAY) ×3 IMPLANT
PAD POSITIONING PINK XL (MISCELLANEOUS) ×3 IMPLANT
PORT LAP GEL ALEXIS MED 5-9CM (MISCELLANEOUS) ×3 IMPLANT
PROTECTOR NERVE ULNAR (MISCELLANEOUS) ×3 IMPLANT
RELOAD STAPLER BLUE 60MM (STAPLE) ×1 IMPLANT
RELOAD STAPLER GOLD 60MM (STAPLE) ×2 IMPLANT
RTRCTR WOUND ALEXIS 18CM MED (MISCELLANEOUS)
SET IRRIG TUBING LAPAROSCOPIC (IRRIGATION / IRRIGATOR) ×3 IMPLANT
SET TUBE SMOKE EVAC HIGH FLOW (TUBING) ×3 IMPLANT
SHEARS HARMONIC ACE PLUS 36CM (ENDOMECHANICALS) ×3 IMPLANT
SLEEVE ADV FIXATION 5X100MM (TROCAR) ×9 IMPLANT
STAPLER ECHELON LONG 60 440 (INSTRUMENTS) ×3 IMPLANT
STAPLER RELOAD BLUE 60MM (STAPLE) ×3
STAPLER RELOAD GOLD 60MM (STAPLE) ×6
STAPLER VISISTAT 35W (STAPLE) IMPLANT
SURGILUBE 2OZ TUBE FLIPTOP (MISCELLANEOUS) IMPLANT
SUT ETHILON 3 0 PS 1 (SUTURE) IMPLANT
SUT MNCRL AB 4-0 PS2 18 (SUTURE) ×3 IMPLANT
SUT NOVA NAB DX-16 0-1 5-0 T12 (SUTURE) IMPLANT
SUT PDS AB 1 CTX 36 (SUTURE) ×6 IMPLANT
SUT PDS AB 1 TP1 96 (SUTURE) IMPLANT
SUT PROLENE 2 0 KS (SUTURE) IMPLANT
SUT PROLENE 2 0 SH DA (SUTURE) IMPLANT
SUT SILK 2 0 (SUTURE) ×2
SUT SILK 2 0 SH CR/8 (SUTURE) ×3 IMPLANT
SUT SILK 2-0 18XBRD TIE 12 (SUTURE) ×1 IMPLANT
SUT SILK 3 0 (SUTURE) ×2
SUT SILK 3 0 SH CR/8 (SUTURE) ×12 IMPLANT
SUT SILK 3-0 18XBRD TIE 12 (SUTURE) ×1 IMPLANT
SUT VIC AB 3-0 SH 18 (SUTURE) IMPLANT
SYS LAPSCP GELPORT 120MM (MISCELLANEOUS)
SYSTEM LAPSCP GELPORT 120MM (MISCELLANEOUS) IMPLANT
TOWEL OR NON WOVEN STRL DISP B (DISPOSABLE) IMPLANT
TRAY FOLEY MTR SLVR 16FR STAT (SET/KITS/TRAYS/PACK) ×3 IMPLANT
TROCAR ADV FIXATION 11X100MM (TROCAR) IMPLANT
TROCAR ADV FIXATION 12X100MM (TROCAR) IMPLANT
TROCAR ADV FIXATION 5X100MM (TROCAR) ×3 IMPLANT
TROCAR BLADELESS OPT 5 100 (ENDOMECHANICALS) ×3 IMPLANT
TROCAR XCEL BLUNT TIP 100MML (ENDOMECHANICALS) IMPLANT
TUBING CONNECTING 10 (TUBING) ×4 IMPLANT
TUBING CONNECTING 10' (TUBING) ×2

## 2018-12-20 NOTE — Interval H&P Note (Signed)
History and Physical Interval Note:  12/20/2018 7:18 AM  Jack Yu  has presented today for surgery, with the diagnosis of TRANSVERSE COLON TUMOR, MULTIPLE COLONIC POLYPS  The various methods of treatment have been discussed with the patient and family.  Brother and sister in room with patient.  After consideration of risks, benefits and other options for treatment, the patient has consented to  Procedure(s): LAPAROSCOPIC EXTENDED RIGHT TRANSVERSE COLECTOMY (Right) as a surgical intervention .  The patient's history has been reviewed, patient examined, no change in status, stable for surgery.  I have reviewed the patient's chart and labs.  Questions were answered to the patient's satisfaction.     Shann Medal

## 2018-12-20 NOTE — Op Note (Signed)
12/20/2018  10:19 AM  PATIENT:  Jack Yu, 68 y.o., male, MRN: [id number]  PREOP DIAGNOSIS:  TRANSVERSE COLON TUMOR, MULTIPLE Right COLONIC POLYPS  POSTOP DIAGNOSIS:   TRANSVERSE COLON TUMOR, MULTIPLE Right COLONIC POLYPS  PROCEDURE:   Procedure(s): LAPAROSCOPIC EXTENDED RIGHT TRANSVERSE COLECTOMY  SURGEON:   Alphonsa Overall, M.D.  Terrence DupontRockne Coons, M.D.  ANESTHESIA:   general  Anesthesiologist: Janeece Riggers, MD CRNA: Cynda Familia, CRNA; Pilar Grammes, CRNA  General  EBL:  100  ml  BLOOD ADMINISTERED: none  DRAINS: none   LOCAL MEDICATIONS USED:   20 cc Exparel + 30 cc 1/4% marcaine  SPECIMEN:   Right colon/right transverse colon  COUNTS CORRECT:  YES  INDICATIONS FOR PROCEDURE:  Jack Yu is a 68 y.o. (DOB: 1951-11-11) white male whose primary care physician is Aura Dials, MD and comes for colectomy for a right transverse colon tumor.  He underwent a colonoscopy by Dr. Alycia Rossetti on 10/25/2018 in which he found several right colon polyps and a large transverse colon polyp.  The largest transverse colon polyp was not amendable to colonoscopic excision.   The indications and risks of the surgery were explained to the patient.  The risks include, but are not limited to, infection, bleeding, and nerve injury.  PROCEDURE: The patient was taken to room #1 he underwent a general anesthesia.  He had a Foley catheter placed.  Both his arms were tucked.  His abdomen is prepped and sterilely draped.  A timeout was held the surgical checklist run.  I accessed the abdominal cavity with a 5 mm Ethicon Optiview trocar in the left upper quadrant.  I placed four more 5 mm trochars: in the left midabdomen, in the left lower abdomen, in the right lower quadrant, and in the mid upper abdomen.  The right and left lobes of liver were unremarkable.  The anterior wall of the stomach was unremarkable.  The gallbladder is unremarkable.  The bowel that I could see was  unremarkable.  There was evidence of tattooing of the transverse colon just to the right of midline.  I first mobilized the right colon elevating the right colon off the retroperitoneum.  Identified the duodenum near posteriorly.  I did a 5 drug fascia posteriorly swept the right colon anterior to this.  I then divided between the gastrocolic ligament and the transverse colon.  Dissection to the area of the mid transverse colon.  This time I thought I had the colon mobile enough to get it to the anterior abdominal wall.  I made an upper midline incision and placed a wound protector.  I was then able to eviscerate the right colon and transverse colon through the midline incision.  I divided the transverse colon 6 cm beyond the palpable polyp.  I used a gold load of the Ethicon 60 mm Echelon stapler.  I divided the terminal ileum with a blue load of the Ethicon 60 mm Echelon stapler.  I divided the mesentery of the right colon and transverse colon with harmonic scalpel and Kelly clamps.  I ligated the major vessels with 2-0 silk sutures.  I sent the specimen to pathology.  I then lined up the terminal ileum with the transverse colon and used a gold load of the 60 mm Ethicon Echelon stapler for side-to-side anastomosis.  I used 2-0 silk sutures with a crotch stitch.  I closed the enterotomy with interrupted 3-0 silk sutures.  This created a 4 cm enterotomy which  was under no tension.  I then placed the omentum over the anastomosis.  The anastomosis was returned to the abdominal cavity.  I re-laparoscope the patient and the bowel seemed to lay in a reasonable position.  There was no bleeding.  I then placed a block using a mixture of 30 cc of quarter percent Marcaine with 20 cc of Exparel.  I placed 15 cc on both sides for bilateral tap blocks.  I placed the remaining 20 cc in the midline wound.  I closed the midline wound with 2 running #1 PDS sutures.   I irrigated the wound with saline.  I then  re-laparoscoped the patient to make sure there is no bleeding or things caught in the midline wound.  I closed the skin at each wound with 4-0 Monocryl suture.  The wound was painted with Dermabond.  Sponge and needle count were correct at the end of the case.  The patient tolerated procedure well transferred recovery in good condition.  Alphonsa Overall, MD, [facility] Surgery Pager: [phone] Office phone:  [phone]

## 2018-12-20 NOTE — Anesthesia Postprocedure Evaluation (Signed)
Anesthesia Post Note  Patient: Saundra Shelling  Procedure(s) Performed: LAPAROSCOPIC EXTENDED RIGHT TRANSVERSE COLECTOMY (Right )     Patient location during evaluation: PACU Anesthesia Type: General Level of consciousness: awake and alert Pain management: pain level controlled Vital Signs Assessment: post-procedure vital signs reviewed and stable Respiratory status: spontaneous breathing, nonlabored ventilation, respiratory function stable and patient connected to nasal cannula oxygen Cardiovascular status: blood pressure returned to baseline and stable Postop Assessment: no apparent nausea or vomiting Anesthetic complications: no    Last Vitals:  Vitals:   12/20/18 1400 12/20/18 1500  BP: (!) 152/91 (!) 142/81  Pulse: 67 73  Resp: 20 20  Temp: (!) 36.3 C 36.5 C  SpO2: 99% 98%    Last Pain:  Vitals:   12/20/18 2018  TempSrc:   PainSc: 9                  Emmalynne Courtney

## 2018-12-20 NOTE — Transfer of Care (Signed)
Immediate Anesthesia Transfer of Care Note  Patient: Jack Yu  Procedure(s) Performed: LAPAROSCOPIC EXTENDED RIGHT TRANSVERSE COLECTOMY (Right )  Patient Location: PACU  Anesthesia Type:General  Level of Consciousness: awake, sedated and patient cooperative  Airway & Oxygen Therapy: Patient Spontanous Breathing and Patient connected to face mask oxygen  Post-op Assessment: Report given to RN and Post -op Vital signs reviewed and stable  Post vital signs: stable  Last Vitals:  Vitals Value Taken Time  BP 168/97 12/20/2018 10:21 AM  Temp    Pulse 71 12/20/2018 10:23 AM  Resp 17 12/20/2018 10:23 AM  SpO2 98 % 12/20/2018 10:23 AM  Vitals shown include unvalidated device data.  Last Pain:  Vitals:   12/20/18 0609  TempSrc:   PainSc: 0-No pain         Complications: No apparent anesthesia complications

## 2018-12-20 NOTE — Anesthesia Procedure Notes (Signed)
Procedure Name: Intubation Date/Time: 12/20/2018 7:30 AM Performed by: Pilar Grammes, CRNA Pre-anesthesia Checklist: Patient identified, Emergency Drugs available, Suction available, Patient being monitored and Timeout performed Patient Re-evaluated:Patient Re-evaluated prior to induction Oxygen Delivery Method: Circle system utilized Preoxygenation: Pre-oxygenation with 100% oxygen Induction Type: IV induction Ventilation: Mask ventilation without difficulty Laryngoscope Size: Miller and 3 Tube type: Oral Tube size: 7.5 mm Number of attempts: 1 Airway Equipment and Method: Stylet Placement Confirmation: positive ETCO2,  ETT inserted through vocal cords under direct vision,  CO2 detector and breath sounds checked- equal and bilateral Secured at: 22 cm Tube secured with: Tape Dental Injury: Teeth and Oropharynx as per pre-operative assessment

## 2018-12-21 ENCOUNTER — Encounter (HOSPITAL_COMMUNITY): Payer: Self-pay | Admitting: Surgery

## 2018-12-21 DIAGNOSIS — K567 Ileus, unspecified: Secondary | ICD-10-CM

## 2018-12-21 DIAGNOSIS — K9189 Other postprocedural complications and disorders of digestive system: Secondary | ICD-10-CM

## 2018-12-21 LAB — CBC
HCT: 40.8 % (ref 39.0–52.0)
Hemoglobin: 13.4 g/dL (ref 13.0–17.0)
MCH: 30.7 pg (ref 26.0–34.0)
MCHC: 32.8 g/dL (ref 30.0–36.0)
MCV: 93.6 fL (ref 80.0–100.0)
Platelets: 231 10*3/uL (ref 150–400)
RBC: 4.36 MIL/uL (ref 4.22–5.81)
RDW: 12.8 % (ref 11.5–15.5)
WBC: 10.9 10*3/uL — ABNORMAL HIGH (ref 4.0–10.5)
nRBC: 0 % (ref 0.0–0.2)

## 2018-12-21 LAB — BASIC METABOLIC PANEL
Anion gap: 7 (ref 5–15)
BUN: 9 mg/dL (ref 8–23)
CO2: 22 mmol/L (ref 22–32)
Calcium: 8.8 mg/dL — ABNORMAL LOW (ref 8.9–10.3)
Chloride: 110 mmol/L (ref 98–111)
Creatinine, Ser: 1 mg/dL (ref 0.61–1.24)
GFR calc Af Amer: >60 mL/min (ref >60)
GFR calc non Af Amer: >60 mL/min (ref >60)
Glucose, Bld: 139 mg/dL — ABNORMAL HIGH (ref 70–99)
Potassium: 4.5 mmol/L (ref 3.5–5.1)
Sodium: 139 mmol/L (ref 135–145)

## 2018-12-21 MED ORDER — TEMAZEPAM 15 MG PO CAPS
30.0000 mg | ORAL_CAPSULE | Freq: Every day | ORAL | Status: DC
  Administered 2018-12-22 – 2019-01-01 (×11): 30 mg via ORAL
  Filled 2018-12-21 (×11): qty 2

## 2018-12-21 MED ORDER — SIMETHICONE 40 MG/0.6ML PO SUSP
40.0000 mg | Freq: Four times a day (QID) | ORAL | Status: DC | PRN
  Filled 2018-12-21: qty 0.6

## 2018-12-21 MED ORDER — SODIUM CHLORIDE 0.9 % IV SOLN
8.0000 mg | Freq: Four times a day (QID) | INTRAVENOUS | Status: DC | PRN
  Filled 2018-12-21: qty 4

## 2018-12-21 MED ORDER — LACTATED RINGERS IV BOLUS: 1000.0000 mL | Freq: Three times a day (TID) | INTRAVENOUS | Status: AC | PRN

## 2018-12-21 MED ORDER — PROCHLORPERAZINE EDISYLATE 10 MG/2ML IJ SOLN
5.0000 mg | INTRAMUSCULAR | Status: DC | PRN
  Administered 2018-12-23 – 2018-12-24 (×3): 10 mg via INTRAVENOUS
  Filled 2018-12-21 (×3): qty 2

## 2018-12-21 MED ORDER — MENTHOL 3 MG MT LOZG
1.0000 | LOZENGE | OROMUCOSAL | Status: DC | PRN
  Filled 2018-12-21: qty 9

## 2018-12-21 MED ORDER — PHENOL 1.4 % MT LIQD
1.0000 | OROMUCOSAL | Status: DC | PRN
  Filled 2018-12-21: qty 177

## 2018-12-21 MED ORDER — HYDROCORTISONE 1 % EX CREA: 1.0000 "application " | TOPICAL_CREAM | Freq: Three times a day (TID) | CUTANEOUS | Status: DC | PRN

## 2018-12-21 MED ORDER — LIP MEDEX EX OINT
1.0000 "application " | TOPICAL_OINTMENT | Freq: Two times a day (BID) | CUTANEOUS | Status: DC
  Administered 2018-12-21 – 2019-01-02 (×22): 1 via TOPICAL
  Filled 2018-12-21 (×2): qty 7

## 2018-12-21 MED ORDER — GUAIFENESIN-DM 100-10 MG/5ML PO SYRP
10.0000 mL | ORAL_SOLUTION | ORAL | Status: DC | PRN
  Administered 2018-12-24 – 2018-12-30 (×4): 10 mL via ORAL
  Filled 2018-12-21 (×4): qty 10

## 2018-12-21 MED ORDER — HYDROCORTISONE 2.5 % RE CREA: 1.0000 "application " | TOPICAL_CREAM | Freq: Four times a day (QID) | RECTAL | Status: DC | PRN

## 2018-12-21 MED ORDER — ALPRAZOLAM 1 MG PO TABS
1.0000 mg | ORAL_TABLET | Freq: Three times a day (TID) | ORAL | Status: DC | PRN
  Administered 2018-12-21 – 2019-01-02 (×21): 1 mg via ORAL
  Filled 2018-12-21 (×21): qty 1

## 2018-12-21 MED ORDER — MAGIC MOUTHWASH
15.0000 mL | Freq: Four times a day (QID) | ORAL | Status: DC | PRN
  Filled 2018-12-21: qty 15

## 2018-12-21 MED ORDER — METHOCARBAMOL 1000 MG/10ML IJ SOLN
1000.0000 mg | Freq: Four times a day (QID) | INTRAVENOUS | Status: DC | PRN
  Filled 2018-12-21: qty 10

## 2018-12-21 MED ORDER — DIPHENHYDRAMINE HCL 50 MG/ML IJ SOLN: 12.5000 mg | Freq: Four times a day (QID) | INTRAMUSCULAR | Status: DC | PRN

## 2018-12-21 MED ORDER — ONDANSETRON HCL 4 MG/2ML IJ SOLN
4.0000 mg | Freq: Four times a day (QID) | INTRAMUSCULAR | Status: DC | PRN
  Administered 2018-12-22 – 2018-12-24 (×5): 4 mg via INTRAVENOUS
  Filled 2018-12-21 (×5): qty 2

## 2018-12-21 MED ORDER — ALUM & MAG HYDROXIDE-SIMETH 200-200-20 MG/5ML PO SUSP
30.0000 mL | Freq: Four times a day (QID) | ORAL | Status: DC | PRN
  Administered 2018-12-21 – 2018-12-23 (×4): 30 mL via ORAL
  Filled 2018-12-21 (×4): qty 30

## 2018-12-21 NOTE — Progress Notes (Signed)
[facility] Office:  [phone] General Surgery Progress Note   LOS: 1 day  POD -  1 Day Post-Op  Chief Complaint: Colon tumor  Assessment and Plan: 1.  LAPAROSCOPIC EXTENDED RIGHT TRANSVERSE COLECTOMY - 12/20/2018 - Christie Copley  WBC - 10,900 - 12/21/2018  Taking clear liquids.  Active.    2.  History of hematuria 3.  To remove foley today 4.  DVT prophylaxis - Lovenox   Active Problems:   Colonic polyp   Subjective:  Walked several times.  Feels bloated, no vomiting.  No BM.  Objective:   Vitals:   12/21/18 0154 12/21/18 0509  BP: (!) 142/83 (!) 141/95  Pulse: 68 (!) 59  Resp: 16 16  Temp: 98.1 F (36.7 C) (!) 97.5 F (36.4 C)  SpO2: 94% 96%     Intake/Output from previous day:  01/21 0701 - 01/22 0700 In: 3737.6 [P.O.:1440; I.V.:2297.6] Out: 3200 [Urine:3125; Blood:75]  Intake/Output this shift:  No intake/output data recorded.   Physical Exam:   General: WN nervous wM who is alert and oriented.    HEENT: Normal. Pupils equal. .   Lungs: Clear.  IS = 1,400 cc   Abdomen: Mild distention.  Few BS.   Wound: Clean   Lab Results:    Recent Labs    12/21/18 0427  WBC 10.9*  HGB 13.4  HCT 40.8  PLT 231    BMET   Recent Labs    12/21/18 0427  NA 139  K 4.5  CL 110  CO2 22  GLUCOSE 139*  BUN 9  CREATININE 1.00  CALCIUM 8.8*    PT/INR  No results for input(s): LABPROT, INR in the last 72 hours.  ABG  No results for input(s): PHART, HCO3 in the last 72 hours.  Invalid input(s): PCO2, PO2   Studies/Results:  No results found.   Anti-infectives:   Anti-infectives (From admission, onward)   Start     Dose/Rate Route Frequency Ordered Stop   12/20/18 0600  cefoTEtan (CEFOTAN) 2 g in sodium chloride 0.9 % 100 mL IVPB     2 g 200 mL/hr over 30 Minutes Intravenous On call to O.R. 12/20/18 0548 12/20/18 9518      Alphonsa Overall, MD, FACS Pager: [facility] Office: [phone] 12/21/2018

## 2018-12-21 NOTE — Progress Notes (Signed)
Dr. Johney Maine aware via phone pt requested home dose of Xanax. Order received to resume.

## 2018-12-21 NOTE — Progress Notes (Signed)
[facility] education reinforced. Did patient attend class prior to procedure? Yes [  x ] No [   ] Discussed: Pain Control [ x  ] Mobility [ x  ] Diet [   ] Other [   ]  Patient sitting in chair. NT reports patient has walked the halls and been sitting in chair most of day. Patient reports gas pains and encouraged him to continue walking the hallways. Looks great. Will follow.  Pecolia Ades, RN, BSN Quality Program Coordinator, [facility] after Surgery 12/21/18 3:33 PM

## 2018-12-22 LAB — BASIC METABOLIC PANEL
Anion gap: 4 — ABNORMAL LOW (ref 5–15)
BUN: 5 mg/dL — AB (ref 8–23)
CO2: 25 mmol/L (ref 22–32)
Calcium: 8.7 mg/dL — ABNORMAL LOW (ref 8.9–10.3)
Chloride: 109 mmol/L (ref 98–111)
Creatinine, Ser: 1.01 mg/dL (ref 0.61–1.24)
GFR calc Af Amer: >60 mL/min (ref >60)
GFR calc non Af Amer: >60 mL/min (ref >60)
Glucose, Bld: 123 mg/dL — ABNORMAL HIGH (ref 70–99)
Potassium: 4.6 mmol/L (ref 3.5–5.1)
Sodium: 138 mmol/L (ref 135–145)

## 2018-12-22 LAB — CBC WITH DIFFERENTIAL/PLATELET
Abs Immature Granulocytes: 0.04 10*3/uL (ref 0.00–0.07)
Basophils Absolute: 0 10*3/uL (ref 0.0–0.1)
Basophils Relative: 0 %
Eosinophils Absolute: 0 10*3/uL (ref 0.0–0.5)
Eosinophils Relative: 0 %
HEMATOCRIT: 38.9 % — AB (ref 39.0–52.0)
Hemoglobin: 12.4 g/dL — ABNORMAL LOW (ref 13.0–17.0)
Immature Granulocytes: 0 %
LYMPHS ABS: 1.4 10*3/uL (ref 0.7–4.0)
Lymphocytes Relative: 14 %
MCH: 31.2 pg (ref 26.0–34.0)
MCHC: 31.9 g/dL (ref 30.0–36.0)
MCV: 97.7 fL (ref 80.0–100.0)
Monocytes Absolute: 0.5 10*3/uL (ref 0.1–1.0)
Monocytes Relative: 6 %
Neutro Abs: 7.6 10*3/uL (ref 1.7–7.7)
Neutrophils Relative %: 80 %
Platelets: 182 10*3/uL (ref 150–400)
RBC: 3.98 MIL/uL — ABNORMAL LOW (ref 4.22–5.81)
RDW: 13.1 % (ref 11.5–15.5)
WBC: 9.7 10*3/uL (ref 4.0–10.5)
nRBC: 0 % (ref 0.0–0.2)

## 2018-12-22 MED ORDER — SALINE SPRAY 0.65 % NA SOLN
1.0000 | NASAL | Status: DC | PRN
  Administered 2018-12-22: 1 via NASAL
  Filled 2018-12-22: qty 44

## 2018-12-22 MED ORDER — IBUPROFEN 200 MG PO TABS
600.0000 mg | ORAL_TABLET | Freq: Four times a day (QID) | ORAL | Status: DC | PRN
  Administered 2018-12-22: 600 mg via ORAL
  Filled 2018-12-22: qty 3

## 2018-12-22 NOTE — Progress Notes (Signed)
[facility] Office:  [phone] General Surgery Progress Note   LOS: 2 days  POD -  2 Days Post-Op  Chief Complaint: Colon tumor  Assessment and Plan: 1.  LAPAROSCOPIC EXTENDED RIGHT TRANSVERSE COLECTOMY - 12/20/2018 - Lawayne Hartig  WBC - 9,700 - 12/22/2018  Will leave on clear liquids.  To continue to stay active.  2.  History of hematuria 3.  Did well with foley out. 4.  DVT prophylaxis - Lovenox   Active Problems:   Colonic polyp  Subjective:  Sore, but doing well.  Did not sleep well last PM, but active.  Had small bloody BM. No vomiting.  Sister in room with patient.  Objective:   Vitals:   12/21/18 2217 12/22/18 0621  BP: (!) 143/92 (!) 144/90  Pulse: 68 66  Resp: 16 16  Temp: 98.4 F (36.9 C) 97.9 F (36.6 C)  SpO2: 96% 96%     Intake/Output from previous day:  01/22 0701 - 01/23 0700 In: 3660 [P.O.:1260; I.V.:2400] Out: 1600 [Urine:1600]  Intake/Output this shift:  No intake/output data recorded.   Physical Exam:   General: WN nervous wM who is alert and oriented. Somewhat anxious.   HEENT: Normal. Pupils equal. .   Lungs: Clear.     Abdomen: Mild distention.  Has BS.   Wound: Clean   Lab Results:    Recent Labs    12/21/18 0427 12/22/18 0537  WBC 10.9* 9.7  HGB 13.4 12.4*  HCT 40.8 38.9*  PLT 231 182    BMET   Recent Labs    12/21/18 0427 12/22/18 0537  NA 139 138  K 4.5 4.6  CL 110 109  CO2 22 25  GLUCOSE 139* 123*  BUN 9 5*  CREATININE 1.00 1.01  CALCIUM 8.8* 8.7*    PT/INR  No results for input(s): LABPROT, INR in the last 72 hours.  ABG  No results for input(s): PHART, HCO3 in the last 72 hours.  Invalid input(s): PCO2, PO2   Studies/Results:  No results found.   Anti-infectives:   Anti-infectives (From admission, onward)   Start     Dose/Rate Route Frequency Ordered Stop   12/20/18 0600  cefoTEtan (CEFOTAN) 2 g in sodium chloride 0.9 % 100 mL IVPB     2 g 200 mL/hr over 30 Minutes Intravenous On call  to O.R. 12/20/18 0548 12/20/18 2979      Alphonsa Overall, MD, FACS Pager: [facility] Office: [phone] 12/22/2018

## 2018-12-22 NOTE — Progress Notes (Signed)
Patient has complaint of moderate gas pain, has been ambulating well. Dr. Johney Maine notified and requested orders for gas pain. Dr. has placed orders and medication has been administered to patient for gas relief.

## 2018-12-23 MED ORDER — SODIUM CHLORIDE 0.9 % NICU IV INFUSION SIMPLE
1000.0000 mL | INJECTION | Freq: Once | INTRAVENOUS | Status: AC
  Administered 2018-12-23: 1000 mL via INTRAVENOUS
  Filled 2018-12-23: qty 1000

## 2018-12-23 NOTE — Progress Notes (Signed)
[facility] Office:  [phone] General Surgery Progress Note   LOS: 3 days  POD -  3 Days Post-Op  Chief Complaint: Colon tumor  Assessment and Plan: 1.  LAPAROSCOPIC EXTENDED RIGHT TRANSVERSE COLECTOMY - 12/20/2018 - Jack Yu  Vomited this AM.  Will leave on clear liquids.  To continue to stay active.  2.  History of hematuria 3.  DVT prophylaxis - Lovenox   Active Problems:   Colonic polyp  Subjective:  Does not appear to feel as good today.  Vomited small amount this AM.  Will leave on clear liquids.  Brother in room with patient.  Objective:   Vitals:   12/22/18 2220 12/23/18 0530  BP: (!) 139/97 (!) 156/103  Pulse: 85 82  Resp: 16 16  Temp: 98.7 F (37.1 C) 98.1 F (36.7 C)  SpO2: 93% 93%     Intake/Output from previous day:  01/23 0701 - 01/24 0700 In: 3200 [P.O.:1200; I.V.:2000] Out: 150 [Urine:150]  Intake/Output this shift:  No intake/output data recorded.   Physical Exam:   General: WN nervous WM who is alert and oriented.    HEENT: Normal. Pupils equal. .   Lungs: Clear.     Abdomen: Mild distention.  Has BS.   Wound: Clean   Lab Results:    Recent Labs    12/21/18 0427 12/22/18 0537  WBC 10.9* 9.7  HGB 13.4 12.4*  HCT 40.8 38.9*  PLT 231 182    BMET   Recent Labs    12/21/18 0427 12/22/18 0537  NA 139 138  K 4.5 4.6  CL 110 109  CO2 22 25  GLUCOSE 139* 123*  BUN 9 5*  CREATININE 1.00 1.01  CALCIUM 8.8* 8.7*    PT/INR  No results for input(s): LABPROT, INR in the last 72 hours.  ABG  No results for input(s): PHART, HCO3 in the last 72 hours.  Invalid input(s): PCO2, PO2   Studies/Results:  No results found.   Anti-infectives:   Anti-infectives (From admission, onward)   Start     Dose/Rate Route Frequency Ordered Stop   12/20/18 0600  cefoTEtan (CEFOTAN) 2 g in sodium chloride 0.9 % 100 mL IVPB     2 g 200 mL/hr over 30 Minutes Intravenous On call to O.R. 12/20/18 0548 12/20/18 8938      Alphonsa Overall, MD, FACS Pager: [facility] Office: [phone] 12/23/2018

## 2018-12-23 NOTE — Care Management Important Message (Signed)
Important Message  Patient Details  Name: Jack Yu MRN: [id number] Date of Birth: Feb 05, 1951   Medicare Important Message Given:  Yes    Kerin Salen 12/23/2018, 10:54 AMImportant Message  Patient Details  Name: Jack Yu MRN: [id number] Date of Birth: 06/03/1951   Medicare Important Message Given:  Yes    Kerin Salen 12/23/2018, 10:53 AM

## 2018-12-24 ENCOUNTER — Inpatient Hospital Stay (HOSPITAL_COMMUNITY): Payer: Medicare HMO

## 2018-12-24 LAB — CBC WITH DIFFERENTIAL/PLATELET
Abs Immature Granulocytes: 0.11 10*3/uL — ABNORMAL HIGH (ref 0.00–0.07)
BASOS ABS: 0 10*3/uL (ref 0.0–0.1)
Basophils Relative: 0 %
EOS PCT: 1 %
Eosinophils Absolute: 0.1 10*3/uL (ref 0.0–0.5)
HCT: 43.6 % (ref 39.0–52.0)
Hemoglobin: 14.2 g/dL (ref 13.0–17.0)
Immature Granulocytes: 1 %
Lymphocytes Relative: 11 %
Lymphs Abs: 1.2 10*3/uL (ref 0.7–4.0)
MCH: 31.3 pg (ref 26.0–34.0)
MCHC: 32.6 g/dL (ref 30.0–36.0)
MCV: 96 fL (ref 80.0–100.0)
Monocytes Absolute: 0.6 10*3/uL (ref 0.1–1.0)
Monocytes Relative: 5 %
Neutro Abs: 9.3 10*3/uL — ABNORMAL HIGH (ref 1.7–7.7)
Neutrophils Relative %: 82 %
Platelets: 236 10*3/uL (ref 150–400)
RBC: 4.54 MIL/uL (ref 4.22–5.81)
RDW: 12.8 % (ref 11.5–15.5)
WBC: 11.4 10*3/uL — ABNORMAL HIGH (ref 4.0–10.5)
nRBC: 0 % (ref 0.0–0.2)

## 2018-12-24 LAB — BASIC METABOLIC PANEL
Anion gap: 10 (ref 5–15)
BUN: 14 mg/dL (ref 8–23)
CO2: 24 mmol/L (ref 22–32)
CREATININE: 1.2 mg/dL (ref 0.61–1.24)
Calcium: 8.8 mg/dL — ABNORMAL LOW (ref 8.9–10.3)
Chloride: 102 mmol/L (ref 98–111)
GFR calc Af Amer: >60 mL/min (ref >60)
GFR calc non Af Amer: >60 mL/min (ref >60)
Glucose, Bld: 148 mg/dL — ABNORMAL HIGH (ref 70–99)
Potassium: 4 mmol/L (ref 3.5–5.1)
Sodium: 136 mmol/L (ref 135–145)

## 2018-12-24 MED ORDER — HYDROCODONE-ACETAMINOPHEN 5-325 MG PO TABS: 1.0000 | ORAL_TABLET | ORAL | Status: DC | PRN

## 2018-12-24 MED ORDER — ALUM & MAG HYDROXIDE-SIMETH 200-200-20 MG/5ML PO SUSP
30.0000 mL | Freq: Four times a day (QID) | ORAL | Status: DC | PRN
  Administered 2018-12-24: 30 mL via ORAL
  Filled 2018-12-24: qty 30

## 2018-12-24 MED ORDER — METOPROLOL TARTRATE 5 MG/5ML IV SOLN
5.0000 mg | Freq: Four times a day (QID) | INTRAVENOUS | Status: DC | PRN
  Administered 2018-12-24: 5 mg via INTRAVENOUS
  Filled 2018-12-24: qty 5

## 2018-12-24 MED ORDER — METOCLOPRAMIDE HCL 5 MG/ML IJ SOLN
10.0000 mg | Freq: Four times a day (QID) | INTRAMUSCULAR | Status: AC
  Administered 2018-12-24 (×2): 10 mg via INTRAVENOUS
  Filled 2018-12-24 (×2): qty 2

## 2018-12-24 MED ORDER — IBUPROFEN 200 MG PO TABS
600.0000 mg | ORAL_TABLET | Freq: Four times a day (QID) | ORAL | Status: DC | PRN
  Administered 2018-12-24 – 2018-12-29 (×2): 600 mg via ORAL
  Filled 2018-12-24 (×2): qty 3

## 2018-12-24 MED ORDER — DIPHENHYDRAMINE HCL 50 MG/ML IJ SOLN: 12.5000 mg | Freq: Four times a day (QID) | INTRAMUSCULAR | Status: DC | PRN

## 2018-12-24 MED ORDER — SODIUM CHLORIDE 0.9 % IV BOLUS
1000.0000 mL | Freq: Once | INTRAVENOUS | Status: AC
  Administered 2018-12-24: 1000 mL via INTRAVENOUS

## 2018-12-24 NOTE — Progress Notes (Signed)
4 Days Post-Op   Subjective/Chief Complaint: Had a small amount of emesis this morning Only mild pain Still passing flatus   Objective: Vital signs in last 24 hours: Temp:  [98.3 F (36.8 C)-98.5 F (36.9 C)] 98.5 F (36.9 C) (01/25 0525) Pulse Rate:  [85-103] 85 (01/25 0525) Resp:  [14-17] 14 (01/25 0525) BP: (134-153)/(78-104) 134/78 (01/25 0525) SpO2:  [90 %-94 %] 90 % (01/25 0525) Weight:  [96.5 kg] 96.5 kg (01/25 0525) Last BM Date: 12/20/18  Intake/Output from previous day: 01/24 0701 - 01/25 0700 In: 3261.6 [P.O.:300; I.V.:2961.6] Out: 350 [Urine:350] Intake/Output this shift: No intake/output data recorded.  Exam: Awake and alert Abdomen distended, quiet, minimally tender  Lab Results:  Recent Labs    12/22/18 0537 12/24/18 0400  WBC 9.7 11.4*  HGB 12.4* 14.2  HCT 38.9* 43.6  PLT 182 236   BMET Recent Labs    12/22/18 0537 12/24/18 0400  NA 138 136  K 4.6 4.0  CL 109 102  CO2 25 24  GLUCOSE 123* 148*  BUN 5* 14  CREATININE 1.01 1.20  CALCIUM 8.7* 8.8*   PT/INR No results for input(s): LABPROT, INR in the last 72 hours. ABG No results for input(s): PHART, HCO3 in the last 72 hours.  Invalid input(s): PCO2, PO2  Studies/Results: No results found.  Anti-infectives: Anti-infectives (From admission, onward)   Start     Dose/Rate Route Frequency Ordered Stop   12/20/18 0600  cefoTEtan (CEFOTAN) 2 g in sodium chloride 0.9 % 100 mL IVPB     2 g 200 mL/hr over 30 Minutes Intravenous On call to O.R. 12/20/18 0548 12/20/18 0738      Assessment/Plan: s/p Procedure(s): LAPAROSCOPIC EXTENDED RIGHT TRANSVERSE COLECTOMY (Right)  Post of ileus  Will check a abdominal xray May need an NG tube ambulate  LOS: 4 days    Coralie Keens 12/24/2018

## 2018-12-25 LAB — CBC
HCT: 40.9 % (ref 39.0–52.0)
Hemoglobin: 13.3 g/dL (ref 13.0–17.0)
MCH: 30.6 pg (ref 26.0–34.0)
MCHC: 32.5 g/dL (ref 30.0–36.0)
MCV: 94.2 fL (ref 80.0–100.0)
Platelets: 252 10*3/uL (ref 150–400)
RBC: 4.34 MIL/uL (ref 4.22–5.81)
RDW: 12.8 % (ref 11.5–15.5)
WBC: 7.6 10*3/uL (ref 4.0–10.5)
nRBC: 0 % (ref 0.0–0.2)

## 2018-12-25 LAB — BASIC METABOLIC PANEL
Anion gap: 8 (ref 5–15)
BUN: 20 mg/dL (ref 8–23)
CALCIUM: 8.9 mg/dL (ref 8.9–10.3)
CO2: 28 mmol/L (ref 22–32)
CREATININE: 1.25 mg/dL — AB (ref 0.61–1.24)
Chloride: 101 mmol/L (ref 98–111)
GFR calc Af Amer: >60 mL/min (ref >60)
GFR calc non Af Amer: 59 mL/min — ABNORMAL LOW (ref >60)
Glucose, Bld: 118 mg/dL — ABNORMAL HIGH (ref 70–99)
Potassium: 4.1 mmol/L (ref 3.5–5.1)
Sodium: 137 mmol/L (ref 135–145)

## 2018-12-25 MED ORDER — KCL IN DEXTROSE-NACL 20-5-0.9 MEQ/L-%-% IV SOLN
INTRAVENOUS | Status: DC
  Administered 2018-12-25 – 2018-12-28 (×7): via INTRAVENOUS
  Filled 2018-12-25 (×10): qty 1000

## 2018-12-25 NOTE — Progress Notes (Signed)
Results from xray back. Pt feels more distended and feels like he cant breath well lying down. MD notified. Ordered to insert ng tube that was previously ordered prn. 4043ml removed from stomach immediately and pt stated he felt much better and now breathing easily.

## 2018-12-25 NOTE — Progress Notes (Signed)
MD notified of pts bp 170/100 and pulse 105. Also informed of pt having some congestion/ family concerned pt is not breathing well.  Chest xray and IV metoprolol ordered. Will continue to monitor pt. Jack Yu

## 2018-12-25 NOTE — Progress Notes (Signed)
5 Days Post-Op   Subjective/Chief Complaint: Feels better since NG placed Passing a little flatus   Objective: Vital signs in last 24 hours: Temp:  [97.6 F (36.4 C)-99.4 F (37.4 C)] 99.4 F (37.4 C) (01/26 0559) Pulse Rate:  [79-106] 91 (01/26 0559) Resp:  [16-17] 17 (01/26 0559) BP: (129-172)/(85-102) 129/85 (01/26 0559) SpO2:  [91 %-94 %] 92 % (01/26 0559) Weight:  [93.5 kg] 93.5 kg (01/26 0559) Last BM Date: 12/24/18  Intake/Output from previous day: 01/25 0701 - 01/26 0700 In: 2721 [P.O.:240; I.V.:2481] Out: 5100 [Urine:300; Emesis/NG output:4800] Intake/Output this shift: No intake/output data recorded.  Exam: Looks much more comfortable Abdomen softer, non-tender  Lab Results:  Recent Labs    12/24/18 0400 12/25/18 0407  WBC 11.4* 7.6  HGB 14.2 13.3  HCT 43.6 40.9  PLT 236 252   BMET Recent Labs    12/24/18 0400 12/25/18 0407  NA 136 137  K 4.0 4.1  CL 102 101  CO2 24 28  GLUCOSE 148* 118*  BUN 14 20  CREATININE 1.20 1.25*  CALCIUM 8.8* 8.9   PT/INR No results for input(s): LABPROT, INR in the last 72 hours. ABG No results for input(s): PHART, HCO3 in the last 72 hours.  Invalid input(s): PCO2, PO2  Studies/Results: Dg Chest Port 1 View  Result Date: 12/24/2018 CLINICAL DATA:  Productive cough for 2 days. EXAM: PORTABLE CHEST 1 VIEW COMPARISON:  None. FINDINGS: Cardiomediastinal silhouette is normal. Mediastinal contours appear intact. There is no evidence of focal airspace consolidation, pleural effusion or pneumothorax. Low lung volumes. Osseous structures are without acute abnormality. Healed right-sided rib fractures. Soft tissues are grossly normal. IMPRESSION: No active disease. Low lung volumes. Electronically Signed   By: Fidela Salisbury M.D.   On: 12/24/2018 22:21   Dg Abd Portable 1v  Result Date: 12/25/2018 CLINICAL DATA:  NG tube placement EXAM: PORTABLE ABDOMEN - 1 VIEW COMPARISON:  Radiographs earlier this day FINDINGS:  Tip and side port of the enteric tube below the diaphragm in the stomach. Decreased gaseous gastric distension from radiographs earlier this day. Persistent air-filled small bowel in the central abdomen. IMPRESSION: Tip and side port of the enteric tube below the diaphragm in the stomach. Decreased gaseous gastric distension. Electronically Signed   By: Keith Rake M.D.   On: 12/25/2018 00:15   Dg Abd Portable 1v  Result Date: 12/24/2018 CLINICAL DATA:  Abdominal distension, 4 days postop laparoscopic surgery for transverse colectomy. EXAM: PORTABLE ABDOMEN - 1 VIEW COMPARISON:  None. FINDINGS: Stomach appears to be distended with air. Additional mildly distended loops of large and small bowel within the upper/central abdomen. Findings suggest postsurgical ileus. IMPRESSION: Probable postsurgical ileus. Significant gaseous distention of the stomach. Consider NG tube for decompression. Electronically Signed   By: Franki Cabot M.D.   On: 12/24/2018 10:29    Anti-infectives: Anti-infectives (From admission, onward)   Start     Dose/Rate Route Frequency Ordered Stop   12/20/18 0600  cefoTEtan (CEFOTAN) 2 g in sodium chloride 0.9 % 100 mL IVPB     2 g 200 mL/hr over 30 Minutes Intravenous On call to O.R. 12/20/18 5883 12/20/18 0738      Assessment/Plan: s/p Procedure(s): LAPAROSCOPIC EXTENDED RIGHT TRANSVERSE COLECTOMY (Right)  Postop ileus  abd xrays better.  Gas in the left colon WBC normal  Continue NG Ambulate   LOS: 5 days    Coralie Keens 12/25/2018

## 2018-12-26 LAB — BASIC METABOLIC PANEL
Anion gap: 8 (ref 5–15)
BUN: 23 mg/dL (ref 8–23)
CO2: 28 mmol/L (ref 22–32)
Calcium: 8.8 mg/dL — ABNORMAL LOW (ref 8.9–10.3)
Chloride: 103 mmol/L (ref 98–111)
Creatinine, Ser: 1.19 mg/dL (ref 0.61–1.24)
GFR calc Af Amer: >60 mL/min (ref >60)
GFR calc non Af Amer: >60 mL/min (ref >60)
Glucose, Bld: 128 mg/dL — ABNORMAL HIGH (ref 70–99)
Potassium: 3.6 mmol/L (ref 3.5–5.1)
Sodium: 139 mmol/L (ref 135–145)

## 2018-12-26 NOTE — Care Management Important Message (Signed)
Important Message  Patient Details  Name: Jack Yu MRN: [id number] Date of Birth: 1950-12-11   Medicare Important Message Given:  Yes    Kerin Salen 12/26/2018, 10:15 AMImportant Message  Patient Details  Name: Jack Yu MRN: [id number] Date of Birth: 1951/07/22   Medicare Important Message Given:  Yes    Kerin Salen 12/26/2018, 10:15 AM

## 2018-12-26 NOTE — Progress Notes (Addendum)
[facility] Office:  [phone] General Surgery Progress Note   LOS: 6 days  POD -  6 Days Post-Op  Chief Complaint: Colon tumor  Assessment and Plan: 1.  LAPAROSCOPIC EXTENDED RIGHT TRANSVERSE COLECTOMY - 12/20/2018 - Jack Yu  Ileus vs SBO  Has NGT, but had BM and flatus today - will clamp NGT and see how he does  2.  Creatinine up a little  Will increase IVF until taking po's well 3.  History of hematuria 4.  DVT prophylaxis - Lovenox   Active Problems:   Colonic polyp  Subjective:  Tough weekend with worsening gaseous distention.  Required NGT.  Had BM and flatus today.  Abdomen feels better.  (spoke to sister Venida Jarvis - by phone at midnight)  Objective:   Vitals:   12/25/18 2217 12/26/18 0529  BP: 126/79 139/80  Pulse: 99 91  Resp: 16 16  Temp: 98.5 F (36.9 C) 98.6 F (37 C)  SpO2: 90% 94%     Intake/Output from previous day:  01/26 0701 - 01/27 0700 In: 2479.3 [I.V.:2479.3] Out: 4425 [Urine:700; Emesis/NG output:3725]  Intake/Output this shift:  No intake/output data recorded.   Physical Exam:   General: WN nervous WM who is alert and oriented.    HEENT: Normal. Pupils equal.  NGT in place. .   Lungs: Clear.     Abdomen: Flatter.  Has BS.  No localized tenderness.  NGT recorded 3,725 cc last 24 hours - but no po intake recorded - and he says he takes a fair amount of ice.   Wound: Clean   Lab Results:    Recent Labs    12/24/18 0400 12/25/18 0407  WBC 11.4* 7.6  HGB 14.2 13.3  HCT 43.6 40.9  PLT 236 252    BMET   Recent Labs    12/24/18 0400 12/25/18 0407  NA 136 137  K 4.0 4.1  CL 102 101  CO2 24 28  GLUCOSE 148* 118*  BUN 14 20  CREATININE 1.20 1.25*  CALCIUM 8.8* 8.9    PT/INR  No results for input(s): LABPROT, INR in the last 72 hours.  ABG  No results for input(s): PHART, HCO3 in the last 72 hours.  Invalid input(s): PCO2, PO2   Studies/Results:  Dg Chest Port 1 View  Result Date: 12/24/2018 CLINICAL  DATA:  Productive cough for 2 days. EXAM: PORTABLE CHEST 1 VIEW COMPARISON:  None. FINDINGS: Cardiomediastinal silhouette is normal. Mediastinal contours appear intact. There is no evidence of focal airspace consolidation, pleural effusion or pneumothorax. Low lung volumes. Osseous structures are without acute abnormality. Healed right-sided rib fractures. Soft tissues are grossly normal. IMPRESSION: No active disease. Low lung volumes. Electronically Signed   By: Fidela Salisbury M.D.   On: 12/24/2018 22:21   Dg Abd Portable 1v  Result Date: 12/25/2018 CLINICAL DATA:  NG tube placement EXAM: PORTABLE ABDOMEN - 1 VIEW COMPARISON:  Radiographs earlier this day FINDINGS: Tip and side port of the enteric tube below the diaphragm in the stomach. Decreased gaseous gastric distension from radiographs earlier this day. Persistent air-filled small bowel in the central abdomen. IMPRESSION: Tip and side port of the enteric tube below the diaphragm in the stomach. Decreased gaseous gastric distension. Electronically Signed   By: Keith Rake M.D.   On: 12/25/2018 00:15   Dg Abd Portable 1v  Result Date: 12/24/2018 CLINICAL DATA:  Abdominal distension, 4 days postop laparoscopic surgery for transverse colectomy. EXAM: PORTABLE ABDOMEN - 1 VIEW COMPARISON:  None. FINDINGS: Stomach appears to be distended with air. Additional mildly distended loops of large and small bowel within the upper/central abdomen. Findings suggest postsurgical ileus. IMPRESSION: Probable postsurgical ileus. Significant gaseous distention of the stomach. Consider NG tube for decompression. Electronically Signed   By: Franki Cabot M.D.   On: 12/24/2018 10:29     Anti-infectives:   Anti-infectives (From admission, onward)   Start     Dose/Rate Route Frequency Ordered Stop   12/20/18 0600  cefoTEtan (CEFOTAN) 2 g in sodium chloride 0.9 % 100 mL IVPB     2 g 200 mL/hr over 30 Minutes Intravenous On call to O.R. 12/20/18 0548 12/20/18  3338      Alphonsa Overall, MD, FACS Pager: [facility] Office: [phone] 12/26/2018

## 2018-12-27 ENCOUNTER — Inpatient Hospital Stay (HOSPITAL_COMMUNITY): Payer: Medicare HMO

## 2018-12-27 ENCOUNTER — Inpatient Hospital Stay: Payer: Self-pay

## 2018-12-27 LAB — CBC WITH DIFFERENTIAL/PLATELET
Abs Immature Granulocytes: 0.01 10*3/uL (ref 0.00–0.07)
Basophils Absolute: 0 10*3/uL (ref 0.0–0.1)
Basophils Relative: 1 %
Eosinophils Absolute: 0.1 10*3/uL (ref 0.0–0.5)
Eosinophils Relative: 2 %
HCT: 42.4 % (ref 39.0–52.0)
Hemoglobin: 13.6 g/dL (ref 13.0–17.0)
Immature Granulocytes: 0 %
Lymphocytes Relative: 14 %
Lymphs Abs: 0.6 10*3/uL — ABNORMAL LOW (ref 0.7–4.0)
MCH: 30.4 pg (ref 26.0–34.0)
MCHC: 32.1 g/dL (ref 30.0–36.0)
MCV: 94.9 fL (ref 80.0–100.0)
Monocytes Absolute: 0.5 10*3/uL (ref 0.1–1.0)
Monocytes Relative: 12 %
Neutro Abs: 3 10*3/uL (ref 1.7–7.7)
Neutrophils Relative %: 71 %
Platelets: 292 10*3/uL (ref 150–400)
RBC: 4.47 MIL/uL (ref 4.22–5.81)
RDW: 13 % (ref 11.5–15.5)
WBC Morphology: INCREASED
WBC: 4.1 10*3/uL (ref 4.0–10.5)
nRBC: 0 % (ref 0.0–0.2)

## 2018-12-27 LAB — BASIC METABOLIC PANEL
Anion gap: 12 (ref 5–15)
BUN: 28 mg/dL — ABNORMAL HIGH (ref 8–23)
CO2: 27 mmol/L (ref 22–32)
Calcium: 8.8 mg/dL — ABNORMAL LOW (ref 8.9–10.3)
Chloride: 102 mmol/L (ref 98–111)
Creatinine, Ser: 1.21 mg/dL (ref 0.61–1.24)
GFR calc Af Amer: >60 mL/min (ref >60)
GLUCOSE: 145 mg/dL — AB (ref 70–99)
Potassium: 3.6 mmol/L (ref 3.5–5.1)
Sodium: 141 mmol/L (ref 135–145)

## 2018-12-27 MED ORDER — SODIUM CHLORIDE 0.9% FLUSH
10.0000 mL | INTRAVENOUS | Status: DC | PRN
  Administered 2018-12-28: 10 mL
  Filled 2018-12-27: qty 40

## 2018-12-27 MED ORDER — POTASSIUM CHLORIDE 10 MEQ/100ML IV SOLN
10.0000 meq | INTRAVENOUS | Status: AC
  Administered 2018-12-27 (×2): 10 meq via INTRAVENOUS
  Filled 2018-12-27 (×2): qty 100

## 2018-12-27 NOTE — Progress Notes (Signed)
[facility] Office:  [phone] General Surgery Progress Note   LOS: 7 days  POD -  7 Days Post-Op  Chief Complaint: Colon tumor  Assessment and Plan: 1.  LAPAROSCOPIC EXTENDED RIGHT TRANSVERSE COLECTOMY - 12/20/2018 - Ji Fairburn  Ileus vs SBO  Has NGT that I have clamped and unclamped last 24 hours - he has passed more flatus.  On the border of removing NGT.  KUB today pending.  2.  History of hematuria 3.  DVT prophylaxis - Lovenox   Active Problems:   Colonic polyp  Subjective:  Has tolerated clamping trial and has passed flatus. Abdomen feels better.  Spoke to sister Venida Jarvis - by phone at bedside  Objective:   Vitals:   12/26/18 2349 12/27/18 0601  BP:  (!) 142/74  Pulse:  76  Resp:  18  Temp: 98.1 F (36.7 C) 98.5 F (36.9 C)  SpO2:  94%     Intake/Output from previous day:  01/27 0701 - 01/28 0700 In: 3301.7 [P.O.:900; I.V.:2401.7] Out: 2850 [Urine:50; Emesis/NG output:2800]  Intake/Output this shift:  Total I/O In: 240 [P.O.:240] Out: 900 [Urine:200; Emesis/NG output:700]   Physical Exam:   General: WN nervous WM who is alert and oriented.    HEENT: Normal. Pupils equal.  NGT in place. .   Lungs: Clear.     Abdomen: Flatter.  Has BS.  No localized tenderness.  NGT recorded 2,800 cc last 24 hours - but poor po intake record.   Wound: Clean   Lab Results:    Recent Labs    12/25/18 0407 12/27/18 0419  WBC 7.6 4.1  HGB 13.3 13.6  HCT 40.9 42.4  PLT 252 292    BMET   Recent Labs    12/26/18 1000 12/27/18 0419  NA 139 141  K 3.6 3.6  CL 103 102  CO2 28 27  GLUCOSE 128* 145*  BUN 23 28*  CREATININE 1.19 1.21  CALCIUM 8.8* 8.8*    PT/INR  No results for input(s): LABPROT, INR in the last 72 hours.  ABG  No results for input(s): PHART, HCO3 in the last 72 hours.  Invalid input(s): PCO2, PO2   Studies/Results:  No results found.   Anti-infectives:   Anti-infectives (From admission, onward)   Start     Dose/Rate  Route Frequency Ordered Stop   12/20/18 0600  cefoTEtan (CEFOTAN) 2 g in sodium chloride 0.9 % 100 mL IVPB     2 g 200 mL/hr over 30 Minutes Intravenous On call to O.R. 12/20/18 0548 12/20/18 2130      Alphonsa Overall, MD, FACS Pager: [facility] Office: [phone] 12/27/2018

## 2018-12-27 NOTE — Progress Notes (Signed)
Patient only voided once throughout the night. He was bladder scanned showed 31 cc of urine. Patient states he doesn't have the urge to void. MD Lucia Gaskins was notified. Will continue to monitor.

## 2018-12-27 NOTE — Progress Notes (Signed)
[facility] NOTE   Pharmacy Consult for TPN Indication: prolonged ileus  Patient Measurements: Height: 6\' 2"  (188 cm) Weight: 195 lb 11.2 oz (88.8 kg) IBW/kg (Calculated) : 82.2 TPN AdjBW (KG): 97.5 Body mass index is 25.13 kg/m. Usual Weight:   Insulin Requirements: none  Current Nutrition: NPO, sips, popcicles  IVF: D5NS 20K at 125 ml/hr  Central access: PICC ordered 1/28, not yet placed TPN start date: 1/29 if PICC placed  ASSESSMENT                                                                                                          HPI: 68 yo M with colon mass, PMH microscopic hematuria,   Significant events: 12/20/18: lap R transverse colectomy  Today: POD#7, ileus vs SBO, tolerated clamping trial and passed flatus. Abd feels better 1/28 KUB: concerning for distal SBO   Glucose: no hx DM, serum glucose 145  Electrolytes: K 3.6 - goal > = 4 for ileus  Renal: SCr 1.21  LFTs:  TGs:  Prealbumin:  NUTRITIONAL GOALS                                                                                             RD recs: consulted 1/28 - pending  Custom TPN at goal rate of   ml/hr provides: -   g/day protein (   g/L) -   g/day Lipid  (   g/L) -   g/day Dextrose (  %) -   Kcal/day  PLAN                                                                                                                          No TPN today as ordered past the deadline and PICC line just ordered/not yet placed. On D5NS 20 K at 125 ml/hr.   2 runs of KCL - goal K >= 4 for ileus   TPN lab panels on Mondays & Thursdays.  F/u daily.   Eudelia Bunch, Pharm.D [phone] 12/27/2018 1:53 PM

## 2018-12-27 NOTE — Progress Notes (Signed)
Initial Nutrition Assessment  DOCUMENTATION CODES:   Non-severe (moderate) malnutrition in context of acute illness/injury  INTERVENTION:   Monitor for diet advancement/toleration TPN per pharmacy  NUTRITION DIAGNOSIS:   Moderate Malnutrition related to acute illness(colon mass/ prolonged ileus) as evidenced by energy intake < 75% for > 7 days, mild fat depletion, mild muscle depletion, moderate muscle depletion.  GOAL:   Patient will meet greater than or equal to 90% of their needs  MONITOR:   Diet advancement, Weight trends, Labs, I & O's  REASON FOR ASSESSMENT:   Consult New TPN/TNA  ASSESSMENT:   Patient with PMH significant for hematuria, hepatic lesion, and alcoholism. Presents this admission with compliant of colon mass with multiple right colonic polyps.    1/21- laparoscopic extended right transverse colectomy 1/25- pt vomited- likely ileus, NGT placed 1/26- NPO  1/28- NGT clamped   Pt with prolonged ileus, remains NPO. RD consulted to begin TPN. Pt waiting PICC placement. Start date tomorrow 1/29.   Pt denies having a loss in appetite PTA. States he typically eats 1-2 meals daily that consist of hamburger steak, green peas, chicken, vegetables. He snacks frequently throughout the day.   Endorses a UBW of 212 lb and denies any wt loss PTA. Per records pt shows to have weighed 218 lb on 02/25/18 at [facility] and 196 lb this admission (10.1% wt loss in 10 months, insignificant for time frame). Nutrition-Focused physical exam completed.   Medications reviewed and include: NS with D5 and 20 mEq KCl @ 125 ml/hr Labs reviewed: CBG 118-145  NUTRITION - FOCUSED PHYSICAL EXAM:    Most Recent Value  Orbital Region  Mild depletion  Upper Arm Region  Mild depletion  Thoracic and Lumbar Region  Unable to assess  Buccal Region  Mild depletion  Temple Region  Severe depletion  Clavicle Bone Region  Moderate depletion  Clavicle and Acromion Bone Region  Moderate  depletion  Scapular Bone Region  Unable to assess  Dorsal Hand  No depletion  Patellar Region  Mild depletion  Anterior Thigh Region  Moderate depletion  Posterior Calf Region  Moderate depletion  Edema (RD Assessment)  None     Diet Order:   Diet Order            Diet NPO time specified Except for: [employer], Sips with Meds  Diet effective now              EDUCATION NEEDS:   Education needs have been addressed  Skin:  Skin Assessment: Skin Integrity Issues: Skin Integrity Issues:: Other (Comment) Other: closed abdomen  Last BM:  1/27  Height:   Ht Readings from Last 1 Encounters:  12/20/18 6\' 2"  (1.88 m)    Weight:   Wt Readings from Last 1 Encounters:  12/27/18 88.8 kg    Ideal Body Weight:  86.4 kg  BMI:  Body mass index is 25.13 kg/m.  Estimated Nutritional Needs:   Kcal:  2400-2600 kcal  Protein:  120-135 grams  Fluid:  >/= 2.4 L/day   Mariana Single RD, LDN Clinical Nutrition Pager # - [phone]

## 2018-12-27 NOTE — Progress Notes (Signed)
Peripherally Inserted Central Catheter/Midline Placement  The IV Nurse has discussed with the patient and/or persons authorized to consent for the patient, the purpose of this procedure and the potential benefits and risks involved with this procedure.  The benefits include less needle sticks, lab draws from the catheter, and the patient may be discharged home with the catheter. Risks include, but not limited to, infection, bleeding, blood clot (thrombus formation), and puncture of an artery; nerve damage and irregular heartbeat and possibility to perform a PICC exchange if needed/ordered by physician.  Alternatives to this procedure were also discussed.  Bard Power PICC patient education guide, fact sheet on infection prevention and patient information card has been provided to patient /or left at bedside.    PICC/Midline Placement Documentation  PICC Double Lumen 12/27/18 PICC Right Brachial 41 cm 0 cm (Active)  Indication for Insertion or Continuance of Line Administration of hyperosmolar/irritating solutions (i.e. TPN, Vancomycin, etc.) 12/27/2018  4:35 PM  Exposed Catheter (cm) 0 cm 12/27/2018  4:35 PM  Site Assessment Clean;Dry;Intact 12/27/2018  4:35 PM  Lumen #1 Status Flushed;Blood return noted;Saline locked 12/27/2018  4:35 PM  Lumen #2 Status Flushed;Blood return noted;Saline locked 12/27/2018  4:35 PM  Dressing Type Transparent 12/27/2018  4:35 PM  Dressing Status Clean;Dry;Intact 12/27/2018  4:35 PM  Dressing Change Due 01/03/19 12/27/2018  4:35 PM       [address] 12/27/2018, 4:45 PM

## 2018-12-28 ENCOUNTER — Inpatient Hospital Stay (HOSPITAL_COMMUNITY): Payer: Medicare HMO

## 2018-12-28 DIAGNOSIS — E440 Moderate protein-calorie malnutrition: Secondary | ICD-10-CM

## 2018-12-28 LAB — COMPREHENSIVE METABOLIC PANEL
ALBUMIN: 3.3 g/dL — AB (ref 3.5–5.0)
ALT: 55 U/L — ABNORMAL HIGH (ref 0–44)
AST: 44 U/L — ABNORMAL HIGH (ref 15–41)
Alkaline Phosphatase: 68 U/L (ref 38–126)
Anion gap: 11 (ref 5–15)
BUN: 30 mg/dL — ABNORMAL HIGH (ref 8–23)
CO2: 27 mmol/L (ref 22–32)
Calcium: 8.7 mg/dL — ABNORMAL LOW (ref 8.9–10.3)
Chloride: 102 mmol/L (ref 98–111)
Creatinine, Ser: 1.35 mg/dL — ABNORMAL HIGH (ref 0.61–1.24)
GFR calc Af Amer: >60 mL/min (ref >60)
GFR calc non Af Amer: 54 mL/min — ABNORMAL LOW (ref >60)
Glucose, Bld: 129 mg/dL — ABNORMAL HIGH (ref 70–99)
Potassium: 3.5 mmol/L (ref 3.5–5.1)
Sodium: 140 mmol/L (ref 135–145)
Total Bilirubin: 1.2 mg/dL (ref 0.3–1.2)
Total Protein: 7.3 g/dL (ref 6.5–8.1)

## 2018-12-28 LAB — DIFFERENTIAL
Abs Immature Granulocytes: 0.03 10*3/uL (ref 0.00–0.07)
BASOS ABS: 0 10*3/uL (ref 0.0–0.1)
BASOS PCT: 0 %
EOS PCT: 2 %
Eosinophils Absolute: 0.2 10*3/uL (ref 0.0–0.5)
Immature Granulocytes: 0 %
Lymphocytes Relative: 17 %
Lymphs Abs: 1.2 10*3/uL (ref 0.7–4.0)
Monocytes Absolute: 0.7 10*3/uL (ref 0.1–1.0)
Monocytes Relative: 10 %
Neutro Abs: 4.9 10*3/uL (ref 1.7–7.7)
Neutrophils Relative %: 71 %

## 2018-12-28 LAB — CBC
HCT: 41.1 % (ref 39.0–52.0)
Hemoglobin: 13 g/dL (ref 13.0–17.0)
MCH: 30.4 pg (ref 26.0–34.0)
MCHC: 31.6 g/dL (ref 30.0–36.0)
MCV: 96 fL (ref 80.0–100.0)
Platelets: 358 10*3/uL (ref 150–400)
RBC: 4.28 MIL/uL (ref 4.22–5.81)
RDW: 12.9 % (ref 11.5–15.5)
WBC: 7 10*3/uL (ref 4.0–10.5)
nRBC: 0 % (ref 0.0–0.2)

## 2018-12-28 LAB — PREALBUMIN: Prealbumin: 12.8 mg/dL — ABNORMAL LOW (ref 18–38)

## 2018-12-28 LAB — PHOSPHORUS: Phosphorus: 4.5 mg/dL (ref 2.5–4.6)

## 2018-12-28 LAB — GLUCOSE, CAPILLARY
Glucose-Capillary: 121 mg/dL — ABNORMAL HIGH (ref 70–99)
Glucose-Capillary: 132 mg/dL — ABNORMAL HIGH (ref 70–99)

## 2018-12-28 LAB — TRIGLYCERIDES: Triglycerides: 131 mg/dL (ref <150)

## 2018-12-28 LAB — MAGNESIUM: Magnesium: 2.6 mg/dL — ABNORMAL HIGH (ref 1.7–2.4)

## 2018-12-28 MED ORDER — TRAVASOL 10 % IV SOLN
INTRAVENOUS | Status: AC
  Administered 2018-12-28: 18:00:00 via INTRAVENOUS
  Filled 2018-12-28: qty 528

## 2018-12-28 MED ORDER — IOHEXOL 300 MG/ML  SOLN: 30.0000 mL | Freq: Once | INTRAMUSCULAR | Status: DC | PRN

## 2018-12-28 MED ORDER — KCL IN DEXTROSE-NACL 20-5-0.9 MEQ/L-%-% IV SOLN
INTRAVENOUS | Status: DC
  Administered 2018-12-28 – 2018-12-29 (×2): via INTRAVENOUS
  Filled 2018-12-28 (×2): qty 1000

## 2018-12-28 MED ORDER — POTASSIUM CHLORIDE 10 MEQ/100ML IV SOLN
10.0000 meq | INTRAVENOUS | Status: AC
  Administered 2018-12-28 (×3): 10 meq via INTRAVENOUS
  Filled 2018-12-28 (×3): qty 100

## 2018-12-28 MED ORDER — INSULIN ASPART 100 UNIT/ML ~~LOC~~ SOLN
0.0000 [IU] | SUBCUTANEOUS | Status: DC
  Administered 2018-12-28 – 2019-01-01 (×8): 1 [IU] via SUBCUTANEOUS

## 2018-12-28 MED ORDER — SODIUM CHLORIDE 0.9 % IV BOLUS
500.0000 mL | Freq: Once | INTRAVENOUS | Status: AC
  Administered 2018-12-28: 500 mL via INTRAVENOUS

## 2018-12-28 NOTE — Progress Notes (Addendum)
[place] [facility] Office:  [phone] General Surgery Progress Note   LOS: 8 days  POD -  8 Days Post-Op  Chief Complaint: Colon tumor  Assessment and Plan: 1.  LAPAROSCOPIC EXTENDED RIGHT TRANSVERSE COLECTOMY - 12/20/2018 Jack Yu  SBO - KUB yesterday showed dilated SB  Had loose BM/flatus while I was visiting  Repeat KUB today - still with SBO pattern - so I will get CT scan to evaluate SBO  2.  Malnutrition/prolonged post op course  Prealb - 12.8 - 12/28/2018  To start TPN today 3.  Elevated creatinine  Creatinine - 1.35 - 12/28/2018 4.  History of hematuria 5.  DVT prophylaxis - Lovenox   Active Problems:   Colonic polyp   Malnutrition of moderate degree  Subjective:  No abdominal pain or nausea.  Does not like the NGT.  Objective:   Vitals:   12/27/18 2125 12/28/18 0549  BP: (!) 151/91 (!) 146/93  Pulse: 94 99  Resp: (!) 24 20  Temp: 97.7 F (36.5 C) (!) 97.4 F (36.3 C)  SpO2: 94% 93%     Intake/Output from previous day:  01/28 0701 - 01/29 0700 In: 5259.2 [P.O.:2580; I.V.:2479.2; IV Piggyback:200] Out: 7169 [Urine:350; Emesis/NG CVELFY:1017]  Intake/Output this shift:  No intake/output data recorded.   Physical Exam:   General: WN nervous WM who is alert and oriented.    HEENT: Normal. Pupils equal.  NGT in place. .   Lungs: Clear.     Abdomen: Flatter.  Has BS.  No localized tenderness.    Wound: Clean   Lab Results:    Recent Labs    12/27/18 0419 12/28/18 0544  WBC 4.1 7.0  HGB 13.6 13.0  HCT 42.4 41.1  PLT 292 358    BMET   Recent Labs    12/27/18 0419 12/28/18 0544  NA 141 140  K 3.6 3.5  CL 102 102  CO2 27 27  GLUCOSE 145* 129*  BUN 28* 30*  CREATININE 1.21 1.35*  CALCIUM 8.8* 8.7*    PT/INR  No results for input(s): LABPROT, INR in the last 72 hours.  ABG  No results for input(s): PHART, HCO3 in the last 72 hours.  Invalid input(s): PCO2, PO2   Studies/Results:  Dg Abd 2 Views  Result Date:  12/27/2018 CLINICAL DATA:  Bowel obstruction. EXAM: ABDOMEN - 2 VIEW COMPARISON:  Radiograph of December 24, 2018. FINDINGS: Distal tip of nasogastric tube is seen in proximal stomach. Mildly dilated small bowel loops are noted with air-fluid levels concerning for distal small bowel obstruction. No colonic dilatation is noted. No free air is noted. Phleboliths are noted in the pelvis. IMPRESSION: Mildly dilated small bowel loops are noted with air-fluid levels concerning for distal small bowel obstruction. Electronically Signed   By: Marijo Conception, M.D.   On: 12/27/2018 11:53   [place] Ekg Site Rite  Result Date: 12/27/2018 If Site Rite image not attached, placement could not be confirmed due to current cardiac rhythm.    Anti-infectives:   Anti-infectives (From admission, onward)   Start     Dose/Rate Route Frequency Ordered Stop   12/20/18 0600  cefoTEtan (CEFOTAN) 2 g in sodium chloride 0.9 % 100 mL IVPB     2 g 200 mL/hr over 30 Minutes Intravenous On call to O.R. 12/20/18 0548 12/20/18 5102      Alphonsa Overall, MD, FACS Pager: [facility] Office: [phone] 12/28/2018

## 2018-12-28 NOTE — Progress Notes (Signed)
[place] NOTE   Pharmacy Consult for TPN Indication: prolonged ileus  Patient Measurements: Height: 6\' 2"  (188 cm) Weight: 193 lb 11.2 oz (87.9 kg) IBW/kg (Calculated) : 82.2 TPN AdjBW (KG): 97.5 Body mass index is 24.87 kg/m. Usual Weight:  Insulin Requirements: none  Current Nutrition: NPO, sips, poscicles  IVF: D5NS 20K 125 ml/hr  Central access: PICC placed 1/28 for TPN TPN start date: 12/28/18  ASSESSMENT                                                                                                          HPI: 68 yo M with colon mass, PMH microscopic hematuria  Significant events: 12/20/18: lap R transverse colectomy  Today: POD#8, ileus vs SBO, had loose BM/flatus this AM, repeat KUB today 1/28 KUB: concerning for distal SBO   Glucose: no hx DM  Electrolytes: K 3.5 after 2 runs yesterday, Phos 4.5, mag 2.6 sl elevated, Ca 8.7, CoCa 9.26  Renal: elevated SCr 1.35, NGO 6585, I/O neg 1677, UOP 351 ml/24 hrs- NS 500 ml bolus given 1/29 am  LFTs: AST/ALT very slightly elevated,   TGs:  131 1/29  Prealbumin: 12.8 1/29  NGO 6585 ml/24 hrs  NUTRITIONAL GOALS                                                                                             RD recs: 12/27/18 Kcal: 2400 - 2600 kcal Protein: 120 - 135 gm Fluid: >/= 2.4 L/day  Custom TPN at goal rate of 100 ml/hr provides: -  132 g/day protein ( 55 g/L) - 72 g/day Lipid  ( 30  g/L) - 360 g/day Dextrose (15 %) - 2472 Kcal/day  PLAN                                                                                                                         Now: 3 runs KCL  At 1800 today:  Start TPN at 40 ml/hr  -  Provides 52 g of protein,  988 kCals per day  Goal rate is 100 ml/hr to provide 100% of goals  Electrolytes in GUR:[id number] except  lower Mg and Phos, Cl:Ac ratio 1:1  Plan to advance as tolerated to the goal rate.  TPN to contain standard  multivitamins and trace elements.  Reduce IVF to 85 ml/hr to keep total IVF 125 ml/hr or per CCS  Add SSI .   TPN lab panels on Mondays & Thursdays.  F/u daily.   Eudelia Bunch, Pharm.D [phone] 12/28/2018 9:56 AM

## 2018-12-29 ENCOUNTER — Inpatient Hospital Stay (HOSPITAL_COMMUNITY): Payer: Medicare HMO

## 2018-12-29 LAB — COMPREHENSIVE METABOLIC PANEL
ALT: 67 U/L — ABNORMAL HIGH (ref 0–44)
AST: 45 U/L — ABNORMAL HIGH (ref 15–41)
Albumin: 3.2 g/dL — ABNORMAL LOW (ref 3.5–5.0)
Alkaline Phosphatase: 70 U/L (ref 38–126)
Anion gap: 10 (ref 5–15)
BUN: 26 mg/dL — ABNORMAL HIGH (ref 8–23)
CO2: 27 mmol/L (ref 22–32)
Calcium: 8.3 mg/dL — ABNORMAL LOW (ref 8.9–10.3)
Chloride: 99 mmol/L (ref 98–111)
Creatinine, Ser: 1.11 mg/dL (ref 0.61–1.24)
GFR calc Af Amer: >60 mL/min (ref >60)
GFR calc non Af Amer: >60 mL/min (ref >60)
Glucose, Bld: 121 mg/dL — ABNORMAL HIGH (ref 70–99)
Potassium: 3.3 mmol/L — ABNORMAL LOW (ref 3.5–5.1)
Sodium: 136 mmol/L (ref 135–145)
Total Bilirubin: 1.1 mg/dL (ref 0.3–1.2)
Total Protein: 7 g/dL (ref 6.5–8.1)

## 2018-12-29 LAB — LIPASE, BLOOD: Lipase: 33 U/L (ref 11–51)

## 2018-12-29 LAB — GLUCOSE, CAPILLARY
Glucose-Capillary: 107 mg/dL — ABNORMAL HIGH (ref 70–99)
Glucose-Capillary: 96 mg/dL (ref 70–99)
Glucose-Capillary: 117 mg/dL — ABNORMAL HIGH (ref 70–99)
Glucose-Capillary: 119 mg/dL — ABNORMAL HIGH (ref 70–99)
Glucose-Capillary: 121 mg/dL — ABNORMAL HIGH (ref 70–99)

## 2018-12-29 LAB — MAGNESIUM: Magnesium: 2.5 mg/dL — ABNORMAL HIGH (ref 1.7–2.4)

## 2018-12-29 LAB — PHOSPHORUS: Phosphorus: 4.7 mg/dL — ABNORMAL HIGH (ref 2.5–4.6)

## 2018-12-29 MED ORDER — TRAVASOL 10 % IV SOLN
INTRAVENOUS | Status: AC
  Administered 2018-12-29: 17:00:00 via INTRAVENOUS
  Filled 2018-12-29: qty 1056

## 2018-12-29 MED ORDER — POTASSIUM CHLORIDE 10 MEQ/100ML IV SOLN
10.0000 meq | INTRAVENOUS | Status: AC
  Administered 2018-12-29 (×3): 10 meq via INTRAVENOUS
  Filled 2018-12-29 (×3): qty 100

## 2018-12-29 MED ORDER — KCL IN DEXTROSE-NACL 20-5-0.9 MEQ/L-%-% IV SOLN
INTRAVENOUS | Status: DC
  Administered 2018-12-29: 19:00:00 via INTRAVENOUS
  Filled 2018-12-29 (×2): qty 1000

## 2018-12-29 NOTE — Progress Notes (Signed)
[place] [facility] Office:  [phone] General Surgery Progress Note   LOS: 9 days  POD -  9 Days Post-Op  Chief Complaint: Colon tumor  Assessment and Plan: 1.  LAPAROSCOPIC EXTENDED RIGHT TRANSVERSE COLECTOMY - 12/20/2018 - Shatarra Wehling  Ileus vs SBO - CT scan last night supports his bowel dilation as an ileus.  Though the KUB today still shows a lot of small bowel gas.  Will plan continued NGT with alternating clamping and unclamping.  2.  Malnutrition/prolonged post op course  Prealb - 12.8 - 12/28/2018  On TPN 3.  Elevated creatinine  Creatinine - 1.11 - 12/29/2018 4.  History of hematuria 5.  DVT prophylaxis - Lovenox   Active Problems:   Colonic polyp   Malnutrition of moderate degree  Subjective:  No abdominal pain or nausea.  Does not like the NGT.  He has had at least 3 BM's today.  I spoke to his sister, Venida Jarvis, on the phone about the CT scan, the plan for continued observation, and that he is holding his own.  Objective:   Vitals:   12/29/18 0552 12/29/18 1351  BP: (!) 140/94 (!) 169/96  Pulse: 90 (!) 106  Resp: 20 18  Temp: 97.6 F (36.4 C) 98.7 F (37.1 C)  SpO2: 94% 98%     Intake/Output from previous day:  01/29 0701 - 01/30 0700 In: 4834.6 [P.O.:2280; I.V.:1813.1; IV Piggyback:741.5] Out: 7106 [Urine:301; Emesis/NG output:5200]  Intake/Output this shift:  Total I/O In: 1023.6 [P.O.:360; I.V.:496.1; IV Piggyback:167.5] Out: 1850 [Emesis/NG output:1850]   Physical Exam:   General: WN nervous WM who is alert and oriented.    HEENT: Normal. Pupils equal.  NGT in place.  In - 2,280 and out of NGT 5,200 (I don't think the intake is correct) .   Lungs: Clear.     Abdomen: Flatter.  Has BS.  No localized tenderness.    Wound: Clean   Lab Results:    Recent Labs    12/27/18 0419 12/28/18 0544  WBC 4.1 7.0  HGB 13.6 13.0  HCT 42.4 41.1  PLT 292 358    BMET   Recent Labs    12/28/18 0544 12/29/18 0440  NA 140 136  K 3.5 3.3*  CL 102  99  CO2 27 27  GLUCOSE 129* 121*  BUN 30* 26*  CREATININE 1.35* 1.11  CALCIUM 8.7* 8.3*    PT/INR  No results for input(s): LABPROT, INR in the last 72 hours.  ABG  No results for input(s): PHART, HCO3 in the last 72 hours.  Invalid input(s): PCO2, PO2   Studies/Results:  Ct Abdomen Pelvis Wo Contrast  Result Date: 12/28/2018 CLINICAL DATA:  Bowel obstruction. Eight days post laparoscopic extended right transverse colectomy. EXAM: CT ABDOMEN AND PELVIS WITHOUT CONTRAST TECHNIQUE: Multidetector CT imaging of the abdomen and pelvis was performed following the standard protocol without IV contrast. No IV contrast administered due to elevated creatinine. COMPARISON:  Prior abdominal radiographs. Liver MRI 11/13/2018 FINDINGS: Lower chest: Mild lower lobe volume loss with atelectasis. Punctate hepatic granuloma in the right lower lobe. No pleural effusion. Tip of the central line faintly visualized in the SVC. Small amount pericardial fluid anteriorly. Hepatobiliary: The 12 mm liver lesion on prior MRI is not well visualized on the current exam the absence of IV contrast. Gallbladder physiologically distended, no calcified stone. No biliary dilatation. Pancreas: No ductal dilatation or inflammation. Spleen: Normal in size without focal abnormality. Adrenals/Urinary Tract: Normal adrenal glands. No hydronephrosis. Mild nonspecific symmetric  ureters are decompressed. Urinary bladder minimally distended. Perinephric edema, similar to prior MRI. Stomach/Bowel: Enteric tube tip in the stomach. Stomach distended with air and enteric contrast. Dilated fluid and contrast filled small bowel. Enteric contrast does not progress beyond the jejunum. Small bowel are dilated throughout its course without transition point. No significant mesenteric edema or small bowel inflammation. Post extended right hemicolectomy with enteric colonic anastomosis in the midline. Mild gaseous distention of transverse colon with small  amount of liquid stool. Liquid stool within descending and sigmoid colon without abnormal distension or colonic wall thickening. Vascular/Lymphatic: Aortic atherosclerosis without aneurysm. Mild branch atherosclerosis. No aneurysm. No enlarged lymph nodes in the abdomen or pelvis. Reproductive: Prominent prostate gland spans 5.6 cm transverse. Other: No free fluid or free air. No focal fluid collection, no evidence of postoperative abscess. No significant inflammatory change in the region of enterocolonic anastomosis. Scattered postsurgical change in the anterior abdominal wall from laparoscopic port sites without subcutaneous fluid collection. Minimal fat in the left inguinal canal. Musculoskeletal: There are no acute or suspicious osseous abnormalities. IMPRESSION: 1. Post recent extended right hemicolectomy. Diffusely dilated fluid-filled small bowel to the enteric colonic anastomosis, additional mild gaseous distension of transverse colon, suggesting postoperative ileus. No discrete transition point to suggest obstruction or significant bowel inflammation. 2. No evidence of postoperative abscess or complication. 3. Known hepatic hemangioma is not well seen in the absence of IV contrast. Aortic Atherosclerosis (ICD10-I70.0). Electronically Signed   By: Keith Rake M.D.   On: 12/28/2018 19:41   Dg Abd 2 Views  Result Date: 12/29/2018 CLINICAL DATA:  Follow up small bowel obstruction EXAM: ABDOMEN - 2 VIEW COMPARISON:  12/28/2018 FINDINGS: Nasogastric catheter is noted within the stomach. Scattered large and small bowel gas is seen. Persistent dilated small bowel loops with air-fluid levels are noted consistent with at least partial small bowel obstruction or ileus. Clinical correlation with the physical exam is recommended. No free air is noted. No bony abnormality is seen. IMPRESSION: Persistent dilated small bowel with air-fluid levels similar to that seen on prior CT examination. Electronically Signed    By: Inez Catalina M.D.   On: 12/29/2018 09:41   Dg Abd 2 Views  Result Date: 12/28/2018 CLINICAL DATA:  Follow up small bowel obstruction EXAM: ABDOMEN - 2 VIEW COMPARISON:  12/27/2018 FINDINGS: Scattered large and small bowel gas is noted. Gastric catheter is noted within the stomach. Multiple air-fluid levels are noted within the small bowel. Persistent small bowel dilatation is noted consistent with at least partial small bowel obstruction. No free air is seen. No bony abnormality is noted. IMPRESSION: Stable appearing partial small bowel obstruction. Electronically Signed   By: Inez Catalina M.D.   On: 12/28/2018 10:04     Anti-infectives:   Anti-infectives (From admission, onward)   Start     Dose/Rate Route Frequency Ordered Stop   12/20/18 0600  cefoTEtan (CEFOTAN) 2 g in sodium chloride 0.9 % 100 mL IVPB     2 g 200 mL/hr over 30 Minutes Intravenous On call to O.R. 12/20/18 0548 12/20/18 1779      Alphonsa Overall, MD, FACS Pager: [facility] Office: [phone] 12/29/2018

## 2018-12-29 NOTE — Progress Notes (Signed)
[place] NOTE   Pharmacy Consult for TPN Indication: prolonged ileus  Patient Measurements: Height: 6\' 2"  (188 cm) Weight: 198 lb 14.4 oz (90.2 kg) IBW/kg (Calculated) : 82.2 TPN AdjBW (KG): 97.5 Body mass index is 25.54 kg/m. Usual Weight:   Insulin Requirements: 2 U SSI  Current Nutrition: NPO, sips, poscicles  IVF: D5NS 20K 85 ml/hr  Central access: PICC placed 1/28 for TPN TPN start date: 12/28/18  ASSESSMENT                                                                                                          HPI: 68 yo M with colon mass, PMH microscopic hematuria  Significant events: 12/20/18: lap R transverse colectomy  Today: POD#9, 1/29 abd CT scan: postop ileus   Glucose: no hx DM, 2 U SSI, CBGs 107-132 on TPN at 40 ml/hr  Electrolytes: K 3.3 after 3 runs yesterday, Phos 4.7 sl elevated, mag 2.5 sl elevated, Ca 8.3, CoCa 8.94  Renal: SCr 1.35> 1.11, NGO 5200, I/O neg 766, UOP 401 ml/24 hrs- NS 500 ml bolus given 1/29 am,   LFTs: AST/ALT very slightly elevated,   TGs:  131 1/29  Prealbumin: 12.8 1/29  NUTRITIONAL GOALS                                                                                             RD recs: 12/27/18 Kcal: 2400 - 2600 kcal Protein: 120 - 135 gm Fluid: >/= 2.4 L/day  Custom TPN at goal rate of 100 ml/hr provides: -  132 g/day protein ( 55 g/L) - 72 g/day Lipid  ( 30  g/L) - 360 g/day Dextrose (15 %) - 2472 Kcal/day  PLAN                                                                                                                         Now: 3 runs KCL  At 1800 today:  Increase TPN to 80 ml/hr  Goal rate is 100 ml/hr to provide 100% of goals  Electrolytes in XHB:ZJIRCVEL except lower Mg and Phos, Cl:Ac ratio 1:1  Plan to advance as tolerated  to the goal rate.  TPN to contain standard multivitamins and trace elements.  Reduce IVF to 45 ml/hr to keep total IVF 125 ml/hr  or per CCS  Continue SSI until at goal rate  TPN lab panels on Mondays & Thursdays. BMET, Mg, Phos in AM  F/u daily.  Eudelia Bunch, Pharm.D [phone] 12/29/2018 7:41 AM

## 2018-12-29 NOTE — Plan of Care (Signed)
Plan of care reviewed and discussed with the patient and his sister.  Questions answered to patient's satisfaction.

## 2018-12-30 ENCOUNTER — Inpatient Hospital Stay (HOSPITAL_COMMUNITY): Payer: Medicare HMO

## 2018-12-30 LAB — PHOSPHORUS: Phosphorus: 4.1 mg/dL (ref 2.5–4.6)

## 2018-12-30 LAB — GLUCOSE, CAPILLARY
GLUCOSE-CAPILLARY: 109 mg/dL — AB (ref 70–99)
GLUCOSE-CAPILLARY: 124 mg/dL — AB (ref 70–99)
Glucose-Capillary: 111 mg/dL — ABNORMAL HIGH (ref 70–99)
Glucose-Capillary: 112 mg/dL — ABNORMAL HIGH (ref 70–99)
Glucose-Capillary: 119 mg/dL — ABNORMAL HIGH (ref 70–99)
Glucose-Capillary: 129 mg/dL — ABNORMAL HIGH (ref 70–99)

## 2018-12-30 LAB — CBC
HCT: 41.5 % (ref 39.0–52.0)
Hemoglobin: 13.4 g/dL (ref 13.0–17.0)
MCH: 30.8 pg (ref 26.0–34.0)
MCHC: 32.3 g/dL (ref 30.0–36.0)
MCV: 95.4 fL (ref 80.0–100.0)
Platelets: 414 10*3/uL — ABNORMAL HIGH (ref 150–400)
RBC: 4.35 MIL/uL (ref 4.22–5.81)
RDW: 12.6 % (ref 11.5–15.5)
WBC: 10.9 10*3/uL — ABNORMAL HIGH (ref 4.0–10.5)
nRBC: 0 % (ref 0.0–0.2)

## 2018-12-30 LAB — BASIC METABOLIC PANEL
ANION GAP: 9 (ref 5–15)
BUN: 27 mg/dL — ABNORMAL HIGH (ref 8–23)
CO2: 27 mmol/L (ref 22–32)
Calcium: 8.5 mg/dL — ABNORMAL LOW (ref 8.9–10.3)
Chloride: 102 mmol/L (ref 98–111)
Creatinine, Ser: 1.09 mg/dL (ref 0.61–1.24)
GFR calc non Af Amer: >60 mL/min (ref >60)
Glucose, Bld: 118 mg/dL — ABNORMAL HIGH (ref 70–99)
Potassium: 4 mmol/L (ref 3.5–5.1)
Sodium: 138 mmol/L (ref 135–145)

## 2018-12-30 LAB — MAGNESIUM: Magnesium: 2.5 mg/dL — ABNORMAL HIGH (ref 1.7–2.4)

## 2018-12-30 MED ORDER — KCL IN DEXTROSE-NACL 20-5-0.9 MEQ/L-%-% IV SOLN
INTRAVENOUS | Status: DC
  Administered 2018-12-30 – 2019-01-01 (×2): via INTRAVENOUS
  Filled 2018-12-30 (×4): qty 1000

## 2018-12-30 MED ORDER — TRAVASOL 10 % IV SOLN
INTRAVENOUS | Status: AC
  Administered 2018-12-30: 17:00:00 via INTRAVENOUS
  Filled 2018-12-30: qty 1320

## 2018-12-30 MED ORDER — ALBUTEROL SULFATE (2.5 MG/3ML) 0.083% IN NEBU
2.5000 mg | INHALATION_SOLUTION | Freq: Four times a day (QID) | RESPIRATORY_TRACT | Status: DC | PRN
  Administered 2018-12-30 (×2): 2.5 mg via RESPIRATORY_TRACT
  Filled 2018-12-30 (×2): qty 3

## 2018-12-30 NOTE — Plan of Care (Signed)
Plan of care reviewed with the patient. 

## 2018-12-30 NOTE — Progress Notes (Signed)
MD notified concerning pt's respirations of 32, temp of 99.8, pt feeling very tired.  Also pt has a sound that resembles stridor.  Orders given, will continue to monitor.

## 2018-12-30 NOTE — Progress Notes (Signed)
[place] NOTE   Pharmacy Consult for TPN Indication: prolonged ileus  Patient Measurements: Height: 6\' 2"  (188 cm) Weight: 191 lb 3.2 oz (86.7 kg) IBW/kg (Calculated) : 82.2 TPN AdjBW (KG): 97.5 Body mass index is 24.55 kg/m. Usual Weight:   Insulin Requirements: 2 U SSI  Current Nutrition: NPO, sips, poscicles, TPN  IVF: D5NS 20K 85 ml/hr  Central access: PICC placed 1/28 for TPN TPN start date: 12/28/18  ASSESSMENT                                                                                                          HPI: 68 yo M with colon mass, PMH microscopic hematuria  Significant events: 12/20/18: lap R transverse colectomy  Today: POD#10, 1/29 abd CT scan: postop ileus, 1/30 KUB SBO   Glucose: no hx DM, 2 U SSI, CBGs 109-121 on TPN at 80 ml/hr  Electrolytes: K 4 after 3 runs yesterday, Phos 4.1, mag 2.5 sl elevated, Ca 8.5, CoCa 9.14  Renal: SCr 1.35> 1.11> 1.09, NGO 3200, I/O +773, UOP 200 ml/24 hrs  LFTs: AST/ALT very slightly elevated,   TGs:  131 1/29  Prealbumin: 12.8 1/29  NUTRITIONAL GOALS                                                                                             RD recs: 12/27/18 Kcal: 2400 - 2600 kcal Protein: 120 - 135 gm Fluid: >/= 2.4 L/day  Custom TPN at goal rate of 100 ml/hr provides: -  132 g/day protein ( 55 g/L) - 72 g/day Lipid  ( 30  g/L) - 360 g/day Dextrose (15 %) - 2472 Kcal/day   Plan:  At 1800 today:  Increase TPN to goal rate of 100 ml/hr  Goal rate is 100 ml/hr to provide 100% of goals  Electrolytes in DGU:[id number] except lower Mg and Phos, Cl:Ac ratio 1:1  TPN to contain standard multivitamins and trace elements.  Reduce IVF to 25 ml/hr to keep total IVF 125 ml/hr or per CCS  Continue SSI until at goal rate - may be able to DC SSI/CBGs tomorrow  TPN lab panels on Mondays & Thursdays. BMET, Mg, Phos in AM  F/u daily.  Eudelia Bunch,  Pharm.D [phone] 12/30/2018 7:28 AM

## 2018-12-30 NOTE — Progress Notes (Addendum)
[facility] Office:  [phone] General Surgery Progress Note   LOS: 10 days  POD -  10 Days Post-Op  Chief Complaint: Colon tumor  Assessment and Plan: 1.  LAPAROSCOPIC EXTENDED RIGHT TRANSVERSE COLECTOMY - 12/20/2018 - Debra Calabretta  Ileus vs SBO - seems to be turning the corner  KUB looks better, though the report on the KUB does not say this.  He had multiple BM/flatus the last 24 hours.  I will try to NGT out.  18:00 - call by nurse because of pulmonary issues.  CXR okay.  WBC mildly elevated.  Coughed up large plug with pulmonary treatment.  Looks better now.  2.  Malnutrition/prolonged post op course  Prealb - 12.8 - 12/28/2018  On TPN 3.  Elevated creatinine 4.  History of hematuria 5.  DVT prophylaxis - Lovenox   Active Problems:   Colonic polyp   Malnutrition of moderate degree  Subjective:  No abdominal pain or nausea.  Had multiple episodes of flatus/BM.  No abdominal complaints.  The NGT is really bothering him.  I spoke to his sister Venida Jarvis [phone]).  Objective:   Vitals:   12/29/18 2213 12/30/18 0531  BP: 136/87 120/88  Pulse: 91 (!) 110  Resp: 18 20  Temp: 98.5 F (36.9 C) (!) 97.3 F (36.3 C)  SpO2: 94% 96%     Intake/Output from previous day:  01/30 0701 - 01/31 0700 In: 4172.6 [P.O.:960; I.V.:3045.2; IV Piggyback:167.5] Out: 3400 [Urine:200; Emesis/NG output:3200]  Intake/Output this shift:  No intake/output data recorded.   Physical Exam:   General: WN nervous WM who is alert and oriented.    HEENT: Normal. Pupils equal.  NGT in place.  Somewhat hoarse voice with NGT .   Lungs: Clear.     Abdomen: Flatter.  Has BS.  No localized tenderness.    Wound: Clean   Lab Results:    Recent Labs    12/28/18 0544  WBC 7.0  HGB 13.0  HCT 41.1  PLT 358    BMET   Recent Labs    12/29/18 0440 12/30/18 0340  NA 136 138  K 3.3* 4.0  CL 99 102  CO2 27 27  GLUCOSE 121* 118*  BUN 26* 27*  CREATININE 1.11 1.09  CALCIUM 8.3*  8.5*    PT/INR  No results for input(s): LABPROT, INR in the last 72 hours.  ABG  No results for input(s): PHART, HCO3 in the last 72 hours.  Invalid input(s): PCO2, PO2   Studies/Results:  Ct Abdomen Pelvis Wo Contrast  Result Date: 12/28/2018 CLINICAL DATA:  Bowel obstruction. Eight days post laparoscopic extended right transverse colectomy. EXAM: CT ABDOMEN AND PELVIS WITHOUT CONTRAST TECHNIQUE: Multidetector CT imaging of the abdomen and pelvis was performed following the standard protocol without IV contrast. No IV contrast administered due to elevated creatinine. COMPARISON:  Prior abdominal radiographs. Liver MRI 11/13/2018 FINDINGS: Lower chest: Mild lower lobe volume loss with atelectasis. Punctate hepatic granuloma in the right lower lobe. No pleural effusion. Tip of the central line faintly visualized in the SVC. Small amount pericardial fluid anteriorly. Hepatobiliary: The 12 mm liver lesion on prior MRI is not well visualized on the current exam the absence of IV contrast. Gallbladder physiologically distended, no calcified stone. No biliary dilatation. Pancreas: No ductal dilatation or inflammation. Spleen: Normal in size without focal abnormality. Adrenals/Urinary Tract: Normal adrenal glands. No hydronephrosis. Mild nonspecific symmetric ureters are decompressed. Urinary bladder minimally distended. Perinephric edema, similar to prior MRI. Stomach/Bowel: Enteric tube  tip in the stomach. Stomach distended with air and enteric contrast. Dilated fluid and contrast filled small bowel. Enteric contrast does not progress beyond the jejunum. Small bowel are dilated throughout its course without transition point. No significant mesenteric edema or small bowel inflammation. Post extended right hemicolectomy with enteric colonic anastomosis in the midline. Mild gaseous distention of transverse colon with small amount of liquid stool. Liquid stool within descending and sigmoid colon without abnormal  distension or colonic wall thickening. Vascular/Lymphatic: Aortic atherosclerosis without aneurysm. Mild branch atherosclerosis. No aneurysm. No enlarged lymph nodes in the abdomen or pelvis. Reproductive: Prominent prostate gland spans 5.6 cm transverse. Other: No free fluid or free air. No focal fluid collection, no evidence of postoperative abscess. No significant inflammatory change in the region of enterocolonic anastomosis. Scattered postsurgical change in the anterior abdominal wall from laparoscopic port sites without subcutaneous fluid collection. Minimal fat in the left inguinal canal. Musculoskeletal: There are no acute or suspicious osseous abnormalities. IMPRESSION: 1. Post recent extended right hemicolectomy. Diffusely dilated fluid-filled small bowel to the enteric colonic anastomosis, additional mild gaseous distension of transverse colon, suggesting postoperative ileus. No discrete transition point to suggest obstruction or significant bowel inflammation. 2. No evidence of postoperative abscess or complication. 3. Known hepatic hemangioma is not well seen in the absence of IV contrast. Aortic Atherosclerosis (ICD10-I70.0). Electronically Signed   By: Keith Rake M.D.   On: 12/28/2018 19:41   Dg Abd 2 Views  Result Date: 12/29/2018 CLINICAL DATA:  Follow up small bowel obstruction EXAM: ABDOMEN - 2 VIEW COMPARISON:  12/28/2018 FINDINGS: Nasogastric catheter is noted within the stomach. Scattered large and small bowel gas is seen. Persistent dilated small bowel loops with air-fluid levels are noted consistent with at least partial small bowel obstruction or ileus. Clinical correlation with the physical exam is recommended. No free air is noted. No bony abnormality is seen. IMPRESSION: Persistent dilated small bowel with air-fluid levels similar to that seen on prior CT examination. Electronically Signed   By: Inez Catalina M.D.   On: 12/29/2018 09:41   Dg Abd 2 Views  Result Date:  12/28/2018 CLINICAL DATA:  Follow up small bowel obstruction EXAM: ABDOMEN - 2 VIEW COMPARISON:  12/27/2018 FINDINGS: Scattered large and small bowel gas is noted. Gastric catheter is noted within the stomach. Multiple air-fluid levels are noted within the small bowel. Persistent small bowel dilatation is noted consistent with at least partial small bowel obstruction. No free air is seen. No bony abnormality is noted. IMPRESSION: Stable appearing partial small bowel obstruction. Electronically Signed   By: Inez Catalina M.D.   On: 12/28/2018 10:04     Anti-infectives:   Anti-infectives (From admission, onward)   Start     Dose/Rate Route Frequency Ordered Stop   12/20/18 0600  cefoTEtan (CEFOTAN) 2 g in sodium chloride 0.9 % 100 mL IVPB     2 g 200 mL/hr over 30 Minutes Intravenous On call to O.R. 12/20/18 0548 12/20/18 4401      Alphonsa Overall, MD, FACS Pager: [facility] Office: [phone] 12/30/2018

## 2018-12-30 NOTE — Progress Notes (Signed)
Pt reports feeling better after breathing treatment and his breathing has calmed down and is less noisy.  Coughing up white phlegm.  Will continue to monitor.

## 2018-12-30 NOTE — Progress Notes (Signed)
ERAS education reinforced. Did patient attend class prior to procedure? Yes [  x ] No [   ] Discussed: Pain Control [   ] Mobility [   ] Diet [   ] Other [ x  ]  Patient encouraged he is progressing. Happy NGT is out. Will follow. Pecolia Ades, RN, BSN Quality Program Coordinator, Enhanced Recovery after Surgery 12/30/18 12:47 PM

## 2018-12-31 DIAGNOSIS — F419 Anxiety disorder, unspecified: Secondary | ICD-10-CM | POA: Diagnosis present

## 2018-12-31 DIAGNOSIS — F1020 Alcohol dependence, uncomplicated: Secondary | ICD-10-CM | POA: Diagnosis present

## 2018-12-31 LAB — BASIC METABOLIC PANEL
Anion gap: 8 (ref 5–15)
BUN: 28 mg/dL — ABNORMAL HIGH (ref 8–23)
CO2: 26 mmol/L (ref 22–32)
Calcium: 8.4 mg/dL — ABNORMAL LOW (ref 8.9–10.3)
Chloride: 103 mmol/L (ref 98–111)
Creatinine, Ser: 0.95 mg/dL (ref 0.61–1.24)
GFR calc Af Amer: >60 mL/min (ref >60)
GFR calc non Af Amer: >60 mL/min (ref >60)
Glucose, Bld: 117 mg/dL — ABNORMAL HIGH (ref 70–99)
POTASSIUM: 4.3 mmol/L (ref 3.5–5.1)
SODIUM: 137 mmol/L (ref 135–145)

## 2018-12-31 LAB — PHOSPHORUS: Phosphorus: 3.6 mg/dL (ref 2.5–4.6)

## 2018-12-31 LAB — GLUCOSE, CAPILLARY
Glucose-Capillary: 119 mg/dL — ABNORMAL HIGH (ref 70–99)
Glucose-Capillary: 112 mg/dL — ABNORMAL HIGH (ref 70–99)
Glucose-Capillary: 104 mg/dL — ABNORMAL HIGH (ref 70–99)
Glucose-Capillary: 118 mg/dL — ABNORMAL HIGH (ref 70–99)
Glucose-Capillary: 104 mg/dL — ABNORMAL HIGH (ref 70–99)
Glucose-Capillary: 122 mg/dL — ABNORMAL HIGH (ref 70–99)

## 2018-12-31 LAB — MAGNESIUM: MAGNESIUM: 2.4 mg/dL (ref 1.7–2.4)

## 2018-12-31 MED ORDER — GABAPENTIN 300 MG PO CAPS
300.0000 mg | ORAL_CAPSULE | Freq: Every day | ORAL | Status: DC
  Administered 2018-12-31: 300 mg via ORAL
  Filled 2018-12-31: qty 1

## 2018-12-31 MED ORDER — SACCHAROMYCES BOULARDII 250 MG PO CAPS
250.0000 mg | ORAL_CAPSULE | Freq: Two times a day (BID) | ORAL | Status: DC
  Administered 2018-12-31 – 2019-01-02 (×5): 250 mg via ORAL
  Filled 2018-12-31 (×5): qty 1

## 2018-12-31 MED ORDER — ACETAMINOPHEN 500 MG PO TABS
500.0000 mg | ORAL_TABLET | Freq: Three times a day (TID) | ORAL | Status: DC
  Administered 2018-12-31 – 2019-01-02 (×7): 500 mg via ORAL
  Filled 2018-12-31 (×7): qty 1

## 2018-12-31 MED ORDER — TRAVASOL 10 % IV SOLN
INTRAVENOUS | Status: DC
  Administered 2018-12-31: 18:00:00 via INTRAVENOUS
  Filled 2018-12-31: qty 1320

## 2018-12-31 MED ORDER — PSYLLIUM 95 % PO PACK
1.0000 | PACK | Freq: Two times a day (BID) | ORAL | Status: DC
  Administered 2018-12-31 – 2019-01-01 (×3): 1 via ORAL
  Filled 2018-12-31 (×4): qty 1

## 2018-12-31 MED ORDER — METHOCARBAMOL 500 MG PO TABS: 1000.0000 mg | ORAL_TABLET | Freq: Four times a day (QID) | ORAL | Status: DC | PRN

## 2018-12-31 NOTE — Progress Notes (Signed)
[facility] NOTE   Pharmacy Consult for TPN Indication: prolonged ileus  Patient Measurements: Height: 6\' 2"  (188 cm) Weight: 196 lb 12.8 oz (89.3 kg) IBW/kg (Calculated) : 82.2 TPN AdjBW (KG): 97.5 Body mass index is 25.27 kg/m. Usual Weight:   Insulin Requirements: 1 U SSI  Current Nutrition: NPO, sips, poscicles, TPN  IVF: D5NS 20K 25 ml/hr  Central access: PICC placed 1/28 for TPN TPN start date: 12/28/18  ASSESSMENT                                                                                                          HPI: 68 yo M with colon mass, PMH microscopic hematuria  Significant events: 12/20/18: lap R transverse colectomy  Today: POD#11, 1/29 abd CT scan: postop ileus, 1/30 KUB SBO   Glucose: no hx DM, 1 U SSI, CBGs 112-117 on TPN at 80 ml/hr  Electrolytes: WNL, CoCa 9.04  Renal: SCr 1.35> 1.11> 1.09>0.95, NGO 600, I/O +2297, UOP unmeasured  LFTs: AST/ALT very slightly elevated,   TGs:  131 1/29  Prealbumin: 12.8 1/29  NUTRITIONAL GOALS                                                                                             RD recs: 12/27/18 Kcal: 2400 - 2600 kcal Protein: 120 - 135 gm Fluid: >/= 2.4 L/day  Custom TPN at goal rate of 100 ml/hr provides: -  132 g/day protein ( 55 g/L) - 72 g/day Lipid  ( 30  g/L) - 360 g/day Dextrose (15 %) - 2472 Kcal/day   Plan:  At 1800 today:  continue TPN at goal rate of 100 ml/hr  Goal rate is 100 ml/hr to provide 100% of goals  Electrolytes in DVV:OHYWVPXT except lower Mg and Phos, Cl:Ac ratio 1:1  TPN to contain standard multivitamins and trace elements.  continue IVF at 25 ml/hr to keep total IVF 125 ml/hr or per CCS  Continue SSI until at goal rate - may be able to DC SSI/CBGs tomorrow  TPN lab panels on Mondays & Thursdays. BMET, Mg, Phos in AM  F/u daily.  Dolly Rias RPh 12/31/2018, 7:50 AM Pager [phone]

## 2018-12-31 NOTE — Progress Notes (Signed)
Jack Yu [id number] [birth year]  CARE TEAM:  PCP: Aura Dials, MD  Outpatient Care Team: Patient Care Team: Aura Dials, MD as PCP - General (Family Medicine) Laurence Spates, MD as Consulting Physician (Gastroenterology)  Inpatient Treatment Team: Treatment Team: Attending Provider: Alphonsa Overall, MD; Technician: Orson Aloe, NT; Technician: Sharren Bridge, NT; Registered Nurse: Heloise Ochoa, RN; Student Nurse: Williams, [place] M, [place]; Registered Nurse: Guadlupe Spanish, RN; Registered Nurse: Yvette Rack, RN; Technician: Abbe Amsterdam, NT; Technician: Leeanne Rio, NT; Registered Nurse: Kerney Elbe, RN   Problem List:   Active Problems:   Colonic polyp   Malnutrition of moderate degree   11 Days Post-Op  12/20/2018  Procedure(s): LAPAROSCOPIC EXTENDED RIGHT TRANSVERSE COLECTOMY     [facility] Stay = 11 days)   Assessment and Plan: 1.  LAPAROSCOPIC EXTENDED RIGHT TRANSVERSE COLECTOMY - 12/20/2018 - [facility]            Large adenomatous polyp of transverse colon.  Excised with negative margins.  Benign.  Reassuring. 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 - HIGH GRADE DYSPLASIA IS NOT IDENTIFIED. - BENIGN APPENDIX. - THERE IS NO EVIDENCE OF CARCINOMA IN 22 OF 22 LYMPH NODES (0/22).  Prolonged postoperative ileus seems to be finally resolving with significant flatus and bowel movements and hunger.  We will advance diet.  Consider weaning off parenteral nutrition if eating better.  2.  Malnutrition/prolonged post op course             Prealb - 12.8 - 12/28/2018             On TPN.  May start weaning if eating better. 3.  Elevated creatinine 4.  History of hematuria 5.  DVT prophylaxis - Lovenox   20 minutes spent in review, evaluation, examination, counseling, and coordination of care.  More than 50% of that time was spent in  counseling.  12/31/2018    Subjective: (Chief complaint)  Feels markedly better.  Walking the hallways constantly.  Denies any nausea vomiting or abdominal pain.  Having flatus and numerous bowel movements.  Hungry.  Wants to eat.  Objective:  Vital signs:  Vitals:   12/30/18 1636 12/30/18 2148 12/30/18 2313 12/31/18 0545  BP:  119/68  131/86  Pulse:  85  84  Resp: 16 (!) 24  20  Temp:  98.2 F (36.8 C)  98.6 F (37 C)  TempSrc:  Oral  Oral  SpO2:  94% 94% 96%  Weight:    89.3 kg  Height:        Last BM Date: 12/30/18  Intake/Output   Yesterday:  01/31 0701 - 02/01 0700 In: 2897 [P.O.:480; I.V.:2417] Out: 600 [Emesis/NG output:600] This shift:  No intake/output data recorded.  Bowel function:  Flatus: YES  BM:  YES  Drain: (No drain)   Physical Exam:  General: Pt awake/alert/oriented x4 in no acute distress Eyes: PERRL, normal EOM.  Sclera clear.  No icterus Neuro: CN II-XII intact w/o focal sensory/motor deficits. Lymph: No head/neck/groin lymphadenopathy Psych:  No delerium/psychosis/paranoia HENT: Normocephalic, Mucus membranes moist.  No thrush Neck: Supple, No tracheal deviation Chest: No chest wall pain w good excursion CV:  Pulses intact.  Regular rhythm MS: Normal AROM mjr joints.  No obvious deformity  Abdomen: Soft.  Nondistended.  Nontender.  No evidence of peritonitis.  No incarcerated hernias.  Ext:  No deformity.  No mjr edema.  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 - HIGH GRADE DYSPLASIA IS NOT IDENTIFIED. - BENIGN APPENDIX. - THERE IS NO EVIDENCE OF CARCINOMA IN 22 OF 22 LYMPH NODES (0/22). - SEE COMMENT. Microscopic Comment The entire 5.1 cm polyp has been submitted for histologic evaluation. (JBK:ecj 12/22/2018) Enid Cutter MD Pathologist, Electronic Signature (Case  signed 12/22/2018)  Labs: Results for orders placed or performed during the hospital encounter of 12/20/18 (from the past 48 hour(s))  Glucose, capillary     Status: Abnormal   Collection Time: 12/29/18 12:04 PM  Result Value Ref Range   Glucose-Capillary 117 (H) 70 - 99 mg/dL  Glucose, capillary     Status: Abnormal   Collection Time: 12/29/18  3:59 PM  Result Value Ref Range   Glucose-Capillary 119 (H) 70 - 99 mg/dL  Glucose, capillary     Status: Abnormal   Collection Time: 12/29/18  8:22 PM  Result Value Ref Range   Glucose-Capillary 121 (H) 70 - 99 mg/dL  Glucose, capillary     Status: Abnormal   Collection Time: 12/30/18 12:00 AM  Result Value Ref Range   Glucose-Capillary 124 (H) 70 - 99 mg/dL  Basic metabolic panel     Status: Abnormal   Collection Time: 12/30/18  3:40 AM  Result Value Ref Range   Sodium 138 135 - 145 mmol/L   Potassium 4.0 3.5 - 5.1 mmol/L    Comment: DELTA CHECK NOTED REPEATED TO VERIFY NO VISIBLE HEMOLYSIS    Chloride 102 98 - 111 mmol/L   CO2 27 22 - 32 mmol/L   Glucose, Bld 118 (H) 70 - 99 mg/dL   BUN 27 (H) 8 - 23 mg/dL   Creatinine, Ser 1.09 0.61 - 1.24 mg/dL   Calcium 8.5 (L) 8.9 - 10.3 mg/dL   GFR calc non Af Amer >60 >60 mL/min   GFR calc Af Amer >60 >60 mL/min   Anion gap 9 5 - 15    Comment: Performed at [facility], [place] [address] [place], [place] [address]  Magnesium     Status: Abnormal   Collection Time: 12/30/18  3:40 AM  Result Value Ref Range   Magnesium 2.5 (H) 1.7 - 2.4 mg/dL    Comment: Performed at [facility], [place] [address], [place], [place] [address]  Phosphorus     Status: None   Collection Time: 12/30/18  3:40 AM  Result Value Ref Range   Phosphorus 4.1 2.5 - 4.6 mg/dL    Comment: Performed at [facility], [place] [address], [place],  [address]  Glucose, capillary     Status: Abnormal   Collection Time: 12/30/18  3:49 AM  Result Value Ref Range    Glucose-Capillary 109 (H) 70 - 99 mg/dL  Glucose, capillary     Status: Abnormal   Collection Time: 12/30/18  8:29 AM  Result Value Ref Range   Glucose-Capillary 129 (H) 70 - 99 mg/dL  Glucose, capillary     Status: Abnormal   Collection Time: 12/30/18 11:46 AM  Result Value Ref Range   Glucose-Capillary 119 (H) 70 - 99 mg/dL  CBC     Status: Abnormal   Collection Time: 12/30/18  3:39 PM  Result Value Ref Range   WBC 10.9 (H) 4.0 - 10.5 K/uL   RBC 4.35 4.22 - 5.81 MIL/uL   Hemoglobin  13.4 13.0 - 17.0 g/dL   HCT 41.5 39.0 - 52.0 %   MCV 95.4 80.0 - 100.0 fL   MCH 30.8 26.0 - 34.0 pg   MCHC 32.3 30.0 - 36.0 g/dL   RDW 12.6 11.5 - 15.5 %   Platelets 414 (H) 150 - 400 K/uL   nRBC 0.0 0.0 - 0.2 %    Comment: Performed at [facility], [place] [address] [place], [place] [address]  Glucose, capillary     Status: Abnormal   Collection Time: 12/30/18  3:50 PM  Result Value Ref Range   Glucose-Capillary 112 (H) 70 - 99 mg/dL  Glucose, capillary     Status: Abnormal   Collection Time: 12/30/18  7:55 PM  Result Value Ref Range   Glucose-Capillary 111 (H) 70 - 99 mg/dL  Glucose, capillary     Status: Abnormal   Collection Time: 12/30/18 11:49 PM  Result Value Ref Range   Glucose-Capillary 119 (H) 70 - 99 mg/dL  Basic metabolic panel     Status: Abnormal   Collection Time: 12/31/18  3:55 AM  Result Value Ref Range   Sodium 137 135 - 145 mmol/L   Potassium 4.3 3.5 - 5.1 mmol/L   Chloride 103 98 - 111 mmol/L   CO2 26 22 - 32 mmol/L   Glucose, Bld 117 (H) 70 - 99 mg/dL   BUN 28 (H) 8 - 23 mg/dL   Creatinine, Ser 0.95 0.61 - 1.24 mg/dL   Calcium 8.4 (L) 8.9 - 10.3 mg/dL   GFR calc non Af Amer >60 >60 mL/min   GFR calc Af Amer >60 >60 mL/min   Anion gap 8 5 - 15    Comment: Performed at [facility], [place] [address] [place], [place] [address]  Magnesium     Status: None   Collection Time: 12/31/18  3:55 AM  Result Value Ref Range   Magnesium 2.4  1.7 - 2.4 mg/dL    Comment: Performed at [facility], [place] [address], [place], [place] [address]  Phosphorus     Status: None   Collection Time: 12/31/18  3:55 AM  Result Value Ref Range   Phosphorus 3.6 2.5 - 4.6 mg/dL    Comment: Performed at [facility], [place] [address], [place], [place] [address]  Glucose, capillary     Status: Abnormal   Collection Time: 12/31/18  4:27 AM  Result Value Ref Range   Glucose-Capillary 112 (H) 70 - 99 mg/dL  Glucose, capillary     Status: Abnormal   Collection Time: 12/31/18  7:35 AM  Result Value Ref Range   Glucose-Capillary 104 (H) 70 - 99 mg/dL    Imaging / Studies: Dg Chest Port 1 View  Result Date: 12/30/2018 CLINICAL DATA:  Shortness of breath. Expiratory stridor. Hoarseness. EXAM: PORTABLE CHEST 1 VIEW COMPARISON:  12/24/2018 FINDINGS: PICC tip is in the superior vena cava just above the cavoatrial junction in good position. Heart size and pulmonary vascularity are normal. Tiny area of scarring at the right lung base laterally. Multiple old healed right posterior rib fractures. No acute infiltrates or effusions. IMPRESSION: No acute abnormalities. Electronically Signed   By: Lorriane Shire M.D.   On: 12/30/2018 15:37   Dg Abd 2 Views  Result Date: 12/30/2018 CLINICAL DATA:  Followup small-bowel obstruction EXAM: ABDOMEN - 2 VIEW COMPARISON:  December 29 2018 FINDINGS: Nasogastric tube is identified distal tip in the stomach. There are several dilated small bowel  loops throughout the abdomen. There is suggestion bowel wall edema which is increased compared to prior exam. The degree small bowel distension is unchanged. IMPRESSION: Findings consistent with small bowel obstruction. The degree of small-bowel distention is unchanged. However, there is suggestion of bowel wall edema, increased compared to prior exam. Electronically Signed   By: Abelardo Diesel M.D.   On: 12/30/2018 10:25   Dg Abd 2 Views  Result Date:  12/29/2018 CLINICAL DATA:  Follow up small bowel obstruction EXAM: ABDOMEN - 2 VIEW COMPARISON:  12/28/2018 FINDINGS: Nasogastric catheter is noted within the stomach. Scattered large and small bowel gas is seen. Persistent dilated small bowel loops with air-fluid levels are noted consistent with at least partial small bowel obstruction or ileus. Clinical correlation with the physical exam is recommended. No free air is noted. No bony abnormality is seen. IMPRESSION: Persistent dilated small bowel with air-fluid levels similar to that seen on prior CT examination. Electronically Signed   By: Inez Catalina M.D.   On: 12/29/2018 09:41    Medications / Allergies: per chart  Antibiotics: Anti-infectives (From admission, onward)   Start     Dose/Rate Route Frequency Ordered Stop   12/20/18 0600  cefoTEtan (CEFOTAN) 2 g in sodium chloride 0.9 % 100 mL IVPB     2 g 200 mL/hr over 30 Minutes Intravenous On call to O.R. 12/20/18 0254 12/20/18 8628        Note: Portions of this report may have been transcribed using voice recognition software. Every effort was made to ensure accuracy; however, inadvertent computerized transcription errors may be present.   Any transcriptional errors that result from this process are unintentional.     Adin Hector, MD, FACS, MASCRS Gastrointestinal and Minimally Invasive Surgery    [address] [place],  [address] [phone] Main / Paging [phone] Fax

## 2019-01-01 LAB — BASIC METABOLIC PANEL
Anion gap: 8 (ref 5–15)
BUN: 29 mg/dL — ABNORMAL HIGH (ref 8–23)
CO2: 24 mmol/L (ref 22–32)
Calcium: 8.7 mg/dL — ABNORMAL LOW (ref 8.9–10.3)
Chloride: 104 mmol/L (ref 98–111)
Creatinine, Ser: 0.92 mg/dL (ref 0.61–1.24)
GFR calc Af Amer: >60 mL/min (ref >60)
Glucose, Bld: 110 mg/dL — ABNORMAL HIGH (ref 70–99)
Potassium: 4.5 mmol/L (ref 3.5–5.1)
Sodium: 136 mmol/L (ref 135–145)

## 2019-01-01 LAB — GLUCOSE, CAPILLARY
GLUCOSE-CAPILLARY: 116 mg/dL — AB (ref 70–99)
Glucose-Capillary: 112 mg/dL — ABNORMAL HIGH (ref 70–99)
Glucose-Capillary: 126 mg/dL — ABNORMAL HIGH (ref 70–99)
Glucose-Capillary: 127 mg/dL — ABNORMAL HIGH (ref 70–99)

## 2019-01-01 LAB — PHOSPHORUS: Phosphorus: 3.4 mg/dL (ref 2.5–4.6)

## 2019-01-01 LAB — MAGNESIUM: MAGNESIUM: 2.1 mg/dL (ref 1.7–2.4)

## 2019-01-01 MED ORDER — ENSURE SURGERY PO LIQD
237.0000 mL | Freq: Two times a day (BID) | ORAL | Status: DC
  Administered 2019-01-01 (×2): 237 mL via ORAL
  Filled 2019-01-01 (×4): qty 237

## 2019-01-01 MED ORDER — BISACODYL 10 MG RE SUPP: 10.0000 mg | Freq: Two times a day (BID) | RECTAL | Status: DC | PRN

## 2019-01-01 MED ORDER — INSULIN ASPART 100 UNIT/ML ~~LOC~~ SOLN: 0.0000 [IU] | Freq: Three times a day (TID) | SUBCUTANEOUS | Status: DC

## 2019-01-01 NOTE — Progress Notes (Signed)
12 Days Post-Op   Subjective/Chief Complaint: No complaints. Feels better. Having lots of gas and bm's   Objective: Vital signs in last 24 hours: Temp:  [97.7 F (36.5 C)-98.2 F (36.8 C)] 98.2 F (36.8 C) (02/02 0447) Pulse Rate:  [86-89] 86 (02/02 0447) Resp:  [16-20] 16 (02/02 0447) BP: (124-134)/(85-88) 134/88 (02/02 0447) SpO2:  [96 %-97 %] 96 % (02/02 0447) Last BM Date: 12/31/18  Intake/Output from previous day: 02/01 0701 - 02/02 0700 In: 2580 [P.O.:1080; I.V.:1500] Out: 100 [Urine:100] Intake/Output this shift: Total I/O In: 50 [I.V.:50] Out: -   General appearance: alert and cooperative Resp: clear to auscultation bilaterally Cardio: regular rate and rhythm GI: soft, nontendern. incisions look good  Lab Results:  Recent Labs    12/30/18 1539  WBC 10.9*  HGB 13.4  HCT 41.5  PLT 414*   BMET Recent Labs    12/31/18 0355 01/01/19 0443  NA 137 136  K 4.3 4.5  CL 103 104  CO2 26 24  GLUCOSE 117* 110*  BUN 28* 29*  CREATININE 0.95 0.92  CALCIUM 8.4* 8.7*   PT/INR No results for input(s): LABPROT, INR in the last 72 hours. ABG No results for input(s): PHART, HCO3 in the last 72 hours.  Invalid input(s): PCO2, PO2  Studies/Results: Dg Chest Port 1 View  Result Date: 12/30/2018 CLINICAL DATA:  Shortness of breath. Expiratory stridor. Hoarseness. EXAM: PORTABLE CHEST 1 VIEW COMPARISON:  12/24/2018 FINDINGS: PICC tip is in the superior vena cava just above the cavoatrial junction in good position. Heart size and pulmonary vascularity are normal. Tiny area of scarring at the right lung base laterally. Multiple old healed right posterior rib fractures. No acute infiltrates or effusions. IMPRESSION: No acute abnormalities. Electronically Signed   By: Lorriane Shire M.D.   On: 12/30/2018 15:37    Anti-infectives: Anti-infectives (From admission, onward)   Start     Dose/Rate Route Frequency Ordered Stop   12/20/18 0600  cefoTEtan (CEFOTAN) 2 g in  sodium chloride 0.9 % 100 mL IVPB     2 g 200 mL/hr over 30 Minutes Intravenous On call to O.R. 12/20/18 0277 12/20/18 0738      Assessment/Plan: s/p Procedure(s): LAPAROSCOPIC EXTENDED RIGHT TRANSVERSE COLECTOMY (Right) Advance diet to soft D/c tpn and monitor cbg's Ambulate If he does well then plan for d/c tomorrow  LOS: 12 days    Autumn Messing III 01/01/2019

## 2019-01-01 NOTE — Progress Notes (Signed)
[facility] NOTE   Pharmacy Consult for TPN Indication: prolonged ileus  Patient Measurements: Height: 6\' 2"  (188 cm) Weight: 196 lb 12.8 oz (89.3 kg) IBW/kg (Calculated) : 82.2 TPN AdjBW (KG): 97.5 Body mass index is 25.27 kg/m. Usual Weight:   Insulin Requirements: 1 U SSI  Current Nutrition:ensure and cardiac diet starting today  IVF: D5NS 20K 25 ml/hr  Central access: PICC placed 1/28 for TPN TPN start date: 12/28/18  ASSESSMENT                                                                                                          HPI: 68 yo M with colon mass, PMH microscopic hematuria  Significant events: 12/20/18: lap R transverse colectomy  Today: POD#12, 1/29 abd CT scan: postop ileus, 1/30 KUB SBO   Glucose: no hx DM, 1 U SSI, CBGs 112-117 on TPN at 80 ml/hr  Electrolytes: WNL, CoCa 9.54  Renal: SCr 1.35> 1.11> 1.09>0.95, NGO 600, I/O +2297, UOP unmeasured  LFTs: AST/ALT very slightly elevated,   TGs:  131 1/29  Prealbumin: 12.8 1/29  NUTRITIONAL GOALS                                                                                             RD recs: 12/27/18 Kcal: 2400 - 2600 kcal Protein: 120 - 135 gm Fluid: >/= 2.4 L/day  Custom TPN at goal rate of 100 ml/hr provides: -  132 g/day protein ( 55 g/L) - 72 g/day Lipid  ( 30  g/L) - 360 g/day Dextrose (15 %) - 2472 Kcal/day   Plan:    TPN stopped per CCS  continue IVF at 25 ml/hr per CCS  Change SSI to with meals  D/c TPN lab panels   Dolly Rias RPh 01/01/2019, 10:05 AM Pager [phone]

## 2019-01-02 LAB — CBC
HCT: 40.6 % (ref 39.0–52.0)
Hemoglobin: 13.2 g/dL (ref 13.0–17.0)
MCH: 31.2 pg (ref 26.0–34.0)
MCHC: 32.5 g/dL (ref 30.0–36.0)
MCV: 96 fL (ref 80.0–100.0)
Platelets: 384 10*3/uL (ref 150–400)
RBC: 4.23 MIL/uL (ref 4.22–5.81)
RDW: 12.8 % (ref 11.5–15.5)
WBC: 14.1 10*3/uL — ABNORMAL HIGH (ref 4.0–10.5)
nRBC: 0 % (ref 0.0–0.2)

## 2019-01-02 LAB — DIFFERENTIAL
Abs Immature Granulocytes: 0.33 10*3/uL — ABNORMAL HIGH (ref 0.00–0.07)
Basophils Absolute: 0.1 10*3/uL (ref 0.0–0.1)
Basophils Relative: 1 %
EOS PCT: 1 %
Eosinophils Absolute: 0.2 10*3/uL (ref 0.0–0.5)
Immature Granulocytes: 2 %
LYMPHS ABS: 2.1 10*3/uL (ref 0.7–4.0)
Lymphocytes Relative: 15 %
Monocytes Absolute: 0.6 10*3/uL (ref 0.1–1.0)
Monocytes Relative: 4 %
Neutro Abs: 10.9 10*3/uL — ABNORMAL HIGH (ref 1.7–7.7)
Neutrophils Relative %: 77 %

## 2019-01-02 LAB — COMPREHENSIVE METABOLIC PANEL
ALT: 88 U/L — AB (ref 0–44)
AST: 34 U/L (ref 15–41)
Albumin: 3.1 g/dL — ABNORMAL LOW (ref 3.5–5.0)
Alkaline Phosphatase: 83 U/L (ref 38–126)
Anion gap: 10 (ref 5–15)
BUN: 28 mg/dL — ABNORMAL HIGH (ref 8–23)
CALCIUM: 8.6 mg/dL — AB (ref 8.9–10.3)
CO2: 22 mmol/L (ref 22–32)
CREATININE: 1.03 mg/dL (ref 0.61–1.24)
Chloride: 104 mmol/L (ref 98–111)
GFR calc Af Amer: >60 mL/min (ref >60)
GFR calc non Af Amer: >60 mL/min (ref >60)
Glucose, Bld: 107 mg/dL — ABNORMAL HIGH (ref 70–99)
Potassium: 4.2 mmol/L (ref 3.5–5.1)
Sodium: 136 mmol/L (ref 135–145)
Total Bilirubin: 0.4 mg/dL (ref 0.3–1.2)
Total Protein: 7.6 g/dL (ref 6.5–8.1)

## 2019-01-02 LAB — GLUCOSE, CAPILLARY: Glucose-Capillary: 116 mg/dL — ABNORMAL HIGH (ref 70–99)

## 2019-01-02 NOTE — Care Management Important Message (Signed)
Important Message  Patient Details  Name: Jack Yu MRN: [id number] Date of Birth: 1951-07-31   Medicare Important Message Given:  Yes    Kerin Salen 01/02/2019, 10:55 AMImportant Message  Patient Details  Name: Jack Yu MRN: [id number] Date of Birth: 26-Jul-1951   Medicare Important Message Given:  Yes    Kerin Salen 01/02/2019, 10:55 AM

## 2019-01-02 NOTE — Discharge Instructions (Signed)
[facility] - DISCHARGE INSTRUCTIONS TO PATIENT  Activity:  Driving - May drive   Lifting - No lifting more than 15 pounds for 1 week, then no limit  Wound Care:   May shower  Diet:  As tolerated  Follow up appointment:  Call Dr. Pollie Friar office [facility]) at [phone] for an appointment in 2 to 3 weeks  Medications and dosages:  Resume your home medications.  Call Dr. Lucia Gaskins or his office  [phone]) if you have:  Temperature greater than 100.4,  Persistent nausea and vomiting,  Severe uncontrolled pain,  Redness, tenderness, or signs of infection (pain, swelling, redness, odor or green/yellow discharge around the site),  Difficulty breathing, headache or visual disturbances,  Any other questions or concerns you may have after discharge.  In an emergency, call 911 or go to an Emergency Department at a nearby hospital.

## 2019-01-02 NOTE — Discharge Summary (Signed)
Physician Discharge Summary  Patient ID:  Jack Yu  MRN: [id number]  DOB/AGE: 1951/09/11 68 y.o.  Admit date: 12/20/2018 Discharge date: 01/02/2019  Discharge Diagnoses:  1.  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  Prolonged post op ileus 3.  Malnutrition 4.  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 Lucia Gaskins  Discharged Condition: good  Hospital Course: Jack Yu is an 68 y.o. male whose primary care physician is Jack Dials, MD and who was admitted 12/20/2018 with a chief complaint of transverse colon polyp. He had a colonoscopy by Dr. Alycia Rossetti on 10/25/2018 and was found to have multiple colonic polyps.  The largest, in the transverse colon, was not amendable to colonoscopic removal.  He was brought to the operating room on 12/20/2018 and underwent  LAPAROSCOPIC EXTENDED RIGHT TRANSVERSE COLECTOMY.   He did well until the weekend of 12/24/2017 when he became distended.  An NGT was placed and he improved, but his bowels continued distended both on PE and KUB.  Because of his prolonged ileus and malnutrition, we started him on TPN on 12/27/2017.  On 12/28/2017, he had an abdominal CT scan, which showed dilated small bowel, but no obvious obstruction.  I left his NGT in until 12/30/2017, when it finally looked like he was turning the corner.  He has been advanced to a regular diet.  He is having bowel movements.  I have talked to his sister, Jack Yu, multiple times during the hospitalization.  He is now ready to go home. The discharge instructions were reviewed with the patient.  Consults: None  Significant Diagnostic Studies: Results for orders placed or performed during the hospital  encounter of 40/97/35  Basic metabolic panel  Result Value Ref Range   Sodium 139 135 - 145 mmol/L   Potassium 4.5 3.5 - 5.1 mmol/L   Chloride 110 98 - 111 mmol/L   CO2 22 22 - 32 mmol/L   Glucose, Bld 139 (H) 70 - 99 mg/dL   BUN 9 8 - 23 mg/dL   Creatinine, Ser 1.00 0.61 - 1.24 mg/dL   Calcium 8.8 (L) 8.9 - 10.3 mg/dL   GFR calc non Af Amer >60 >60 mL/min   GFR calc Af Amer >60 >60 mL/min   Anion gap 7 5 - 15  CBC  Result Value Ref Range   WBC 10.9 (H) 4.0 - 10.5 K/uL   RBC 4.36 4.22 - 5.81 MIL/uL   Hemoglobin 13.4 13.0 - 17.0 g/dL   HCT 40.8 39.0 - 52.0 %   MCV 93.6 80.0 - 100.0 fL   MCH 30.7 26.0 - 34.0 pg   MCHC 32.8 30.0 - 36.0 g/dL   RDW 12.8 11.5 - 15.5 %   Platelets 231 150 - 400 K/uL   nRBC 0.0 0.0 - 0.2 %  CBC with Differential/Platelet  Result Value Ref Range   WBC 9.7 4.0 - 10.5 K/uL   RBC 3.98 (L) 4.22 - 5.81 MIL/uL   Hemoglobin 12.4 (L) 13.0 - 17.0 g/dL   HCT 38.9 (L) 39.0 - 52.0 %   MCV 97.7 80.0 - 100.0 fL   MCH 31.2 26.0 - 34.0 pg   MCHC 31.9 30.0 - 36.0 g/dL   RDW 13.1 11.5 - 15.5 %  Platelets 182 150 - 400 K/uL   nRBC 0.0 0.0 - 0.2 %   Neutrophils Relative % 80 %   Neutro Abs 7.6 1.7 - 7.7 K/uL   Lymphocytes Relative 14 %   Lymphs Abs 1.4 0.7 - 4.0 K/uL   Monocytes Relative 6 %   Monocytes Absolute 0.5 0.1 - 1.0 K/uL   Eosinophils Relative 0 %   Eosinophils Absolute 0.0 0.0 - 0.5 K/uL   Basophils Relative 0 %   Basophils Absolute 0.0 0.0 - 0.1 K/uL   Immature Granulocytes 0 %   Abs Immature Granulocytes 0.04 0.00 - 0.07 K/uL  Basic metabolic panel  Result Value Ref Range   Sodium 138 135 - 145 mmol/L   Potassium 4.6 3.5 - 5.1 mmol/L   Chloride 109 98 - 111 mmol/L   CO2 25 22 - 32 mmol/L   Glucose, Bld 123 (H) 70 - 99 mg/dL   BUN 5 (L) 8 - 23 mg/dL   Creatinine, Ser 1.01 0.61 - 1.24 mg/dL   Calcium 8.7 (L) 8.9 - 10.3 mg/dL   GFR calc non Af Amer >60 >60 mL/min   GFR calc Af Amer >60 >60 mL/min   Anion gap 4 (L) 5 - 15  CBC with  Differential/Platelet  Result Value Ref Range   WBC 11.4 (H) 4.0 - 10.5 K/uL   RBC 4.54 4.22 - 5.81 MIL/uL   Hemoglobin 14.2 13.0 - 17.0 g/dL   HCT 43.6 39.0 - 52.0 %   MCV 96.0 80.0 - 100.0 fL   MCH 31.3 26.0 - 34.0 pg   MCHC 32.6 30.0 - 36.0 g/dL   RDW 12.8 11.5 - 15.5 %   Platelets 236 150 - 400 K/uL   nRBC 0.0 0.0 - 0.2 %   Neutrophils Relative % 82 %   Neutro Abs 9.3 (H) 1.7 - 7.7 K/uL   Lymphocytes Relative 11 %   Lymphs Abs 1.2 0.7 - 4.0 K/uL   Monocytes Relative 5 %   Monocytes Absolute 0.6 0.1 - 1.0 K/uL   Eosinophils Relative 1 %   Eosinophils Absolute 0.1 0.0 - 0.5 K/uL   Basophils Relative 0 %   Basophils Absolute 0.0 0.0 - 0.1 K/uL   Immature Granulocytes 1 %   Abs Immature Granulocytes 0.11 (H) 0.00 - 0.07 K/uL  Basic metabolic panel  Result Value Ref Range   Sodium 136 135 - 145 mmol/L   Potassium 4.0 3.5 - 5.1 mmol/L   Chloride 102 98 - 111 mmol/L   CO2 24 22 - 32 mmol/L   Glucose, Bld 148 (H) 70 - 99 mg/dL   BUN 14 8 - 23 mg/dL   Creatinine, Ser 1.20 0.61 - 1.24 mg/dL   Calcium 8.8 (L) 8.9 - 10.3 mg/dL   GFR calc non Af Amer >60 >60 mL/min   GFR calc Af Amer >60 >60 mL/min   Anion gap 10 5 - 15  CBC  Result Value Ref Range   WBC 7.6 4.0 - 10.5 K/uL   RBC 4.34 4.22 - 5.81 MIL/uL   Hemoglobin 13.3 13.0 - 17.0 g/dL   HCT 40.9 39.0 - 52.0 %   MCV 94.2 80.0 - 100.0 fL   MCH 30.6 26.0 - 34.0 pg   MCHC 32.5 30.0 - 36.0 g/dL   RDW 12.8 11.5 - 15.5 %   Platelets 252 150 - 400 K/uL   nRBC 0.0 0.0 - 0.2 %  Basic metabolic panel  Result Value Ref Range   Sodium  137 135 - 145 mmol/L   Potassium 4.1 3.5 - 5.1 mmol/L   Chloride 101 98 - 111 mmol/L   CO2 28 22 - 32 mmol/L   Glucose, Bld 118 (H) 70 - 99 mg/dL   BUN 20 8 - 23 mg/dL   Creatinine, Ser 1.25 (H) 0.61 - 1.24 mg/dL   Calcium 8.9 8.9 - 10.3 mg/dL   GFR calc non Af Amer 59 (L) >60 mL/min   GFR calc Af Amer >60 >60 mL/min   Anion gap 8 5 - 15  Basic metabolic panel  Result Value Ref Range    Sodium 139 135 - 145 mmol/L   Potassium 3.6 3.5 - 5.1 mmol/L   Chloride 103 98 - 111 mmol/L   CO2 28 22 - 32 mmol/L   Glucose, Bld 128 (H) 70 - 99 mg/dL   BUN 23 8 - 23 mg/dL   Creatinine, Ser 1.19 0.61 - 1.24 mg/dL   Calcium 8.8 (L) 8.9 - 10.3 mg/dL   GFR calc non Af Amer >60 >60 mL/min   GFR calc Af Amer >60 >60 mL/min   Anion gap 8 5 - 15  Basic metabolic panel  Result Value Ref Range   Sodium 141 135 - 145 mmol/L   Potassium 3.6 3.5 - 5.1 mmol/L   Chloride 102 98 - 111 mmol/L   CO2 27 22 - 32 mmol/L   Glucose, Bld 145 (H) 70 - 99 mg/dL   BUN 28 (H) 8 - 23 mg/dL   Creatinine, Ser 1.21 0.61 - 1.24 mg/dL   Calcium 8.8 (L) 8.9 - 10.3 mg/dL   GFR calc non Af Amer >60 >60 mL/min   GFR calc Af Amer >60 >60 mL/min   Anion gap 12 5 - 15  CBC with Differential/Platelet  Result Value Ref Range   WBC 4.1 4.0 - 10.5 K/uL   RBC 4.47 4.22 - 5.81 MIL/uL   Hemoglobin 13.6 13.0 - 17.0 g/dL   HCT 42.4 39.0 - 52.0 %   MCV 94.9 80.0 - 100.0 fL   MCH 30.4 26.0 - 34.0 pg   MCHC 32.1 30.0 - 36.0 g/dL   RDW 13.0 11.5 - 15.5 %   Platelets 292 150 - 400 K/uL   nRBC 0.0 0.0 - 0.2 %   Neutrophils Relative % 71 %   Neutro Abs 3.0 1.7 - 7.7 K/uL   Lymphocytes Relative 14 %   Lymphs Abs 0.6 (L) 0.7 - 4.0 K/uL   Monocytes Relative 12 %   Monocytes Absolute 0.5 0.1 - 1.0 K/uL   Eosinophils Relative 2 %   Eosinophils Absolute 0.1 0.0 - 0.5 K/uL   Basophils Relative 1 %   Basophils Absolute 0.0 0.0 - 0.1 K/uL   WBC Morphology INCREASED BANDS (>20% BANDS)    Immature Granulocytes 0 %   Abs Immature Granulocytes 0.01 0.00 - 0.07 K/uL  Comprehensive metabolic panel  Result Value Ref Range   Sodium 140 135 - 145 mmol/L   Potassium 3.5 3.5 - 5.1 mmol/L   Chloride 102 98 - 111 mmol/L   CO2 27 22 - 32 mmol/L   Glucose, Bld 129 (H) 70 - 99 mg/dL   BUN 30 (H) 8 - 23 mg/dL   Creatinine, Ser 1.35 (H) 0.61 - 1.24 mg/dL   Calcium 8.7 (L) 8.9 - 10.3 mg/dL   Total Protein 7.3 6.5 - 8.1 g/dL   Albumin  3.3 (L) 3.5 - 5.0 g/dL   AST 44 (H) 15 - 41  U/L   ALT 55 (H) 0 - 44 U/L   Alkaline Phosphatase 68 38 - 126 U/L   Total Bilirubin 1.2 0.3 - 1.2 mg/dL   GFR calc non Af Amer 54 (L) >60 mL/min   GFR calc Af Amer >60 >60 mL/min   Anion gap 11 5 - 15  Prealbumin  Result Value Ref Range   Prealbumin 12.8 (L) 18 - 38 mg/dL  Magnesium  Result Value Ref Range   Magnesium 2.6 (H) 1.7 - 2.4 mg/dL  Phosphorus  Result Value Ref Range   Phosphorus 4.5 2.5 - 4.6 mg/dL  Triglycerides  Result Value Ref Range   Triglycerides 131 <150 mg/dL  CBC  Result Value Ref Range   WBC 7.0 4.0 - 10.5 K/uL   RBC 4.28 4.22 - 5.81 MIL/uL   Hemoglobin 13.0 13.0 - 17.0 g/dL   HCT 41.1 39.0 - 52.0 %   MCV 96.0 80.0 - 100.0 fL   MCH 30.4 26.0 - 34.0 pg   MCHC 31.6 30.0 - 36.0 g/dL   RDW 12.9 11.5 - 15.5 %   Platelets 358 150 - 400 K/uL   nRBC 0.0 0.0 - 0.2 %  Differential  Result Value Ref Range   Neutrophils Relative % 71 %   Neutro Abs 4.9 1.7 - 7.7 K/uL   Lymphocytes Relative 17 %   Lymphs Abs 1.2 0.7 - 4.0 K/uL   Monocytes Relative 10 %   Monocytes Absolute 0.7 0.1 - 1.0 K/uL   Eosinophils Relative 2 %   Eosinophils Absolute 0.2 0.0 - 0.5 K/uL   Basophils Relative 0 %   Basophils Absolute 0.0 0.0 - 0.1 K/uL   Immature Granulocytes 0 %   Abs Immature Granulocytes 0.03 0.00 - 0.07 K/uL   Polychromasia PRESENT   Comprehensive metabolic panel  Result Value Ref Range   Sodium 136 135 - 145 mmol/L   Potassium 3.3 (L) 3.5 - 5.1 mmol/L   Chloride 99 98 - 111 mmol/L   CO2 27 22 - 32 mmol/L   Glucose, Bld 121 (H) 70 - 99 mg/dL   BUN 26 (H) 8 - 23 mg/dL   Creatinine, Ser 1.11 0.61 - 1.24 mg/dL   Calcium 8.3 (L) 8.9 - 10.3 mg/dL   Total Protein 7.0 6.5 - 8.1 g/dL   Albumin 3.2 (L) 3.5 - 5.0 g/dL   AST 45 (H) 15 - 41 U/L   ALT 67 (H) 0 - 44 U/L   Alkaline Phosphatase 70 38 - 126 U/L   Total Bilirubin 1.1 0.3 - 1.2 mg/dL   GFR calc non Af Amer >60 >60 mL/min   GFR calc Af Amer >60 >60 mL/min    Anion gap 10 5 - 15  Magnesium  Result Value Ref Range   Magnesium 2.5 (H) 1.7 - 2.4 mg/dL  Phosphorus  Result Value Ref Range   Phosphorus 4.7 (H) 2.5 - 4.6 mg/dL  Glucose, capillary  Result Value Ref Range   Glucose-Capillary 121 (H) 70 - 99 mg/dL  Glucose, capillary  Result Value Ref Range   Glucose-Capillary 132 (H) 70 - 99 mg/dL  Glucose, capillary  Result Value Ref Range   Glucose-Capillary 107 (H) 70 - 99 mg/dL  Glucose, capillary  Result Value Ref Range   Glucose-Capillary 96 70 - 99 mg/dL  Lipase, blood  Result Value Ref Range   Lipase 33 11 - 51 U/L  Glucose, capillary  Result Value Ref Range   Glucose-Capillary 117 (H) 70 - 99 mg/dL  Glucose, capillary  Result Value Ref Range   Glucose-Capillary 119 (H) 70 - 99 mg/dL  Basic metabolic panel  Result Value Ref Range   Sodium 138 135 - 145 mmol/L   Potassium 4.0 3.5 - 5.1 mmol/L   Chloride 102 98 - 111 mmol/L   CO2 27 22 - 32 mmol/L   Glucose, Bld 118 (H) 70 - 99 mg/dL   BUN 27 (H) 8 - 23 mg/dL   Creatinine, Ser 1.09 0.61 - 1.24 mg/dL   Calcium 8.5 (L) 8.9 - 10.3 mg/dL   GFR calc non Af Amer >60 >60 mL/min   GFR calc Af Amer >60 >60 mL/min   Anion gap 9 5 - 15  Magnesium  Result Value Ref Range   Magnesium 2.5 (H) 1.7 - 2.4 mg/dL  Phosphorus  Result Value Ref Range   Phosphorus 4.1 2.5 - 4.6 mg/dL  Glucose, capillary  Result Value Ref Range   Glucose-Capillary 121 (H) 70 - 99 mg/dL  Glucose, capillary  Result Value Ref Range   Glucose-Capillary 124 (H) 70 - 99 mg/dL  Glucose, capillary  Result Value Ref Range   Glucose-Capillary 109 (H) 70 - 99 mg/dL  Glucose, capillary  Result Value Ref Range   Glucose-Capillary 129 (H) 70 - 99 mg/dL  CBC  Result Value Ref Range   WBC 10.9 (H) 4.0 - 10.5 K/uL   RBC 4.35 4.22 - 5.81 MIL/uL   Hemoglobin 13.4 13.0 - 17.0 g/dL   HCT 41.5 39.0 - 52.0 %   MCV 95.4 80.0 - 100.0 fL   MCH 30.8 26.0 - 34.0 pg   MCHC 32.3 30.0 - 36.0 g/dL   RDW 12.6 11.5 - 15.5 %    Platelets 414 (H) 150 - 400 K/uL   nRBC 0.0 0.0 - 0.2 %  Glucose, capillary  Result Value Ref Range   Glucose-Capillary 112 (H) 70 - 99 mg/dL  Basic metabolic panel  Result Value Ref Range   Sodium 137 135 - 145 mmol/L   Potassium 4.3 3.5 - 5.1 mmol/L   Chloride 103 98 - 111 mmol/L   CO2 26 22 - 32 mmol/L   Glucose, Bld 117 (H) 70 - 99 mg/dL   BUN 28 (H) 8 - 23 mg/dL   Creatinine, Ser 0.95 0.61 - 1.24 mg/dL   Calcium 8.4 (L) 8.9 - 10.3 mg/dL   GFR calc non Af Amer >60 >60 mL/min   GFR calc Af Amer >60 >60 mL/min   Anion gap 8 5 - 15  Magnesium  Result Value Ref Range   Magnesium 2.4 1.7 - 2.4 mg/dL  Phosphorus  Result Value Ref Range   Phosphorus 3.6 2.5 - 4.6 mg/dL  Glucose, capillary  Result Value Ref Range   Glucose-Capillary 111 (H) 70 - 99 mg/dL  Glucose, capillary  Result Value Ref Range   Glucose-Capillary 119 (H) 70 - 99 mg/dL  Glucose, capillary  Result Value Ref Range   Glucose-Capillary 112 (H) 70 - 99 mg/dL  Glucose, capillary  Result Value Ref Range   Glucose-Capillary 104 (H) 70 - 99 mg/dL  Glucose, capillary  Result Value Ref Range   Glucose-Capillary 119 (H) 70 - 99 mg/dL  Glucose, capillary  Result Value Ref Range   Glucose-Capillary 118 (H) 70 - 99 mg/dL  Glucose, capillary  Result Value Ref Range   Glucose-Capillary 104 (H) 70 - 99 mg/dL  Basic metabolic panel  Result Value Ref Range   Sodium 136 135 - 145 mmol/L  Potassium 4.5 3.5 - 5.1 mmol/L   Chloride 104 98 - 111 mmol/L   CO2 24 22 - 32 mmol/L   Glucose, Bld 110 (H) 70 - 99 mg/dL   BUN 29 (H) 8 - 23 mg/dL   Creatinine, Ser 0.92 0.61 - 1.24 mg/dL   Calcium 8.7 (L) 8.9 - 10.3 mg/dL   GFR calc non Af Amer >60 >60 mL/min   GFR calc Af Amer >60 >60 mL/min   Anion gap 8 5 - 15  Magnesium  Result Value Ref Range   Magnesium 2.1 1.7 - 2.4 mg/dL  Phosphorus  Result Value Ref Range   Phosphorus 3.4 2.5 - 4.6 mg/dL                                                                         Comprehensive metabolic panel  Result Value Ref Range   Sodium 136 135 - 145 mmol/L   Potassium 4.2 3.5 - 5.1 mmol/L   Chloride 104 98 - 111 mmol/L   CO2 22 22 - 32 mmol/L   Glucose, Bld 107 (H) 70 - 99 mg/dL   BUN 28 (H) 8 - 23 mg/dL   Creatinine, Ser 1.03 0.61 - 1.24 mg/dL   Calcium 8.6 (L) 8.9 - 10.3 mg/dL   Total Protein 7.6 6.5 - 8.1 g/dL   Albumin 3.1 (L) 3.5 - 5.0 g/dL   AST 34 15 - 41 U/L   ALT 88 (H) 0 - 44 U/L   Alkaline Phosphatase 83 38 - 126 U/L   Total Bilirubin 0.4 0.3 - 1.2 mg/dL   GFR calc non Af Amer >60 >60 mL/min   GFR calc Af Amer >60 >60 mL/min   Anion gap 10 5 - 15  CBC  Result Value Ref Range   WBC 14.1 (H) 4.0 - 10.5 K/uL   RBC 4.23 4.22 - 5.81 MIL/uL   Hemoglobin 13.2 13.0 - 17.0 g/dL   HCT 40.6 39.0 - 52.0 %   MCV 96.0 80.0 - 100.0 fL   MCH 31.2 26.0 - 34.0 pg   MCHC 32.5 30.0 - 36.0 g/dL   RDW 12.8 11.5 - 15.5 %   Platelets 384 150 - 400 K/uL   nRBC 0.0 0.0 - 0.2 %  Differential  Result Value Ref Range   Neutrophils Relative % 77 %   Neutro Abs 10.9 (H) 1.7 - 7.7 K/uL   Lymphocytes Relative 15 %   Lymphs Abs 2.1 0.7 - 4.0 K/uL   Monocytes Relative 4 %   Monocytes Absolute 0.6 0.1 - 1.0 K/uL   Eosinophils Relative 1 %   Eosinophils Absolute 0.2 0.0 - 0.5 K/uL   Basophils Relative 1 %   Basophils Absolute 0.1 0.0 - 0.1 K/uL   Immature Granulocytes 2 %   Abs Immature Granulocytes 0.33 (H) 0.00 - 0.07 K/uL  Glucose, capillary  Result Value Ref Range   Glucose-Capillary 116 (H) 70 - 99 mg/dL    Ct Abdomen Pelvis Wo Contrast  Result Date: 12/28/2018 CLINICAL DATA:  Bowel obstruction. Eight days post laparoscopic extended right transverse colectomy. EXAM: CT ABDOMEN AND PELVIS WITHOUT CONTRAST TECHNIQUE: Multidetector CT imaging of the abdomen and pelvis was performed following the standard protocol without IV  contrast. No IV contrast administered due to elevated creatinine. COMPARISON:  Prior abdominal radiographs. Liver MRI  11/13/2018 FINDINGS: Lower chest: Mild lower lobe volume loss with atelectasis. Punctate hepatic granuloma in the right lower lobe. No pleural effusion. Tip of the central line faintly visualized in the SVC. Small amount pericardial fluid anteriorly. Hepatobiliary: The 12 mm liver lesion on prior MRI is not well visualized on the current exam the absence of IV contrast. Gallbladder physiologically distended, no calcified stone. No biliary dilatation. Pancreas: No ductal dilatation or inflammation. Spleen: Normal in size without focal abnormality. Adrenals/Urinary Tract: Normal adrenal glands. No hydronephrosis. Mild nonspecific symmetric ureters are decompressed. Urinary bladder minimally distended. Perinephric edema, similar to prior MRI. Stomach/Bowel: Enteric tube tip in the stomach. Stomach distended with air and enteric contrast. Dilated fluid and contrast filled small bowel. Enteric contrast does not progress beyond the jejunum. Small bowel are dilated throughout its course without transition point. No significant mesenteric edema or small bowel inflammation. Post extended right hemicolectomy with enteric colonic anastomosis in the midline. Mild gaseous distention of transverse colon with small amount of liquid stool. Liquid stool within descending and sigmoid colon without abnormal distension or colonic wall thickening. Vascular/Lymphatic: Aortic atherosclerosis without aneurysm. Mild branch atherosclerosis. No aneurysm. No enlarged lymph nodes in the abdomen or pelvis. Reproductive: Prominent prostate gland spans 5.6 cm transverse. Other: No free fluid or free air. No focal fluid collection, no evidence of postoperative abscess. No significant inflammatory change in the region of enterocolonic anastomosis. Scattered postsurgical change in the anterior abdominal wall from laparoscopic port sites without subcutaneous fluid collection. Minimal fat in the left inguinal canal. Musculoskeletal: There are no acute  or suspicious osseous abnormalities. IMPRESSION: 1. Post recent extended right hemicolectomy. Diffusely dilated fluid-filled small bowel to the enteric colonic anastomosis, additional mild gaseous distension of transverse colon, suggesting postoperative ileus. No discrete transition point to suggest obstruction or significant bowel inflammation. 2. No evidence of postoperative abscess or complication. 3. Known hepatic hemangioma is not well seen in the absence of IV contrast. Aortic Atherosclerosis (ICD10-I70.0). Electronically Signed   By: Keith Rake M.D.   On: 12/28/2018 19:41   Dg Chest Port 1 View  Dg Abd 2 Views  Result Date: 12/30/2018 CLINICAL DATA:  Followup small-bowel obstruction EXAM: ABDOMEN - 2 VIEW COMPARISON:  December 29 2018 FINDINGS: Nasogastric tube is identified distal tip in the stomach. There are several dilated small bowel loops throughout the abdomen. There is suggestion bowel wall edema which is increased compared to prior exam. The degree small bowel distension is unchanged. IMPRESSION: Findings consistent with small bowel obstruction. The degree of small-bowel distention is unchanged. However, there is suggestion of bowel wall edema, increased compared to prior exam. Electronically Signed   By: Abelardo Diesel M.D.   On: 12/30/2018 10:25   Discharge Exam:  Vitals:   01/01/19 2141 01/02/19 0610  BP: 132/88 117/83  Pulse: 91 (!) 103  Resp: 18 18  Temp: 97.7 F (36.5 C) 98 F (36.7 C)  SpO2: 94% 94%    General: WN WM who is alert and generally healthy appearing.   He is also very nervous. Lungs: Clear to auscultation and symmetric breath sounds. Heart:  RRR. No murmur or rub. Abdomen: Soft. No mass. No tenderness. Normal bowel sounds.  His incision looks good.  Discharge Medications:   Allergies as of 01/02/2019   No Known Allergies     Medication List    TAKE these medications   ALPRAZolam 1 MG  tablet Commonly known as:  XANAX Take 1 mg by mouth 3 (three)  times daily as needed for anxiety.   temazepam 30 MG capsule Commonly known as:  RESTORIL Take 30 mg by mouth at bedtime.       Disposition: Discharge disposition: 01-Home or Self Care       Discharge Instructions    Diet - low sodium heart healthy   Complete by:  As directed    Increase activity slowly   Complete by:  As directed      Signed: Alphonsa Overall, M.D., [facility] Office:  [phone]  01/02/2019, 10:45 AM

## 2019-01-11 DIAGNOSIS — D369 Benign neoplasm, unspecified site: Secondary | ICD-10-CM | POA: Diagnosis not present

## 2019-01-11 DIAGNOSIS — G479 Sleep disorder, unspecified: Secondary | ICD-10-CM | POA: Diagnosis not present

## 2019-01-11 DIAGNOSIS — F419 Anxiety disorder, unspecified: Secondary | ICD-10-CM | POA: Diagnosis not present

## 2019-01-11 DIAGNOSIS — J029 Acute pharyngitis, unspecified: Secondary | ICD-10-CM | POA: Diagnosis not present

## 2019-01-11 DIAGNOSIS — Z87898 Personal history of other specified conditions: Secondary | ICD-10-CM | POA: Diagnosis not present

## 2019-01-16 DIAGNOSIS — R361 Hematospermia: Secondary | ICD-10-CM | POA: Diagnosis not present

## 2019-02-02 DIAGNOSIS — H524 Presbyopia: Secondary | ICD-10-CM | POA: Diagnosis not present

## 2019-02-02 DIAGNOSIS — H25813 Combined forms of age-related cataract, bilateral: Secondary | ICD-10-CM | POA: Diagnosis not present

## 2019-02-06 DIAGNOSIS — Z85828 Personal history of other malignant neoplasm of skin: Secondary | ICD-10-CM | POA: Diagnosis not present

## 2019-02-06 DIAGNOSIS — L570 Actinic keratosis: Secondary | ICD-10-CM | POA: Diagnosis not present

## 2019-02-06 DIAGNOSIS — L814 Other melanin hyperpigmentation: Secondary | ICD-10-CM | POA: Diagnosis not present

## 2019-02-06 DIAGNOSIS — L821 Other seborrheic keratosis: Secondary | ICD-10-CM | POA: Diagnosis not present

## 2019-02-06 DIAGNOSIS — Z08 Encounter for follow-up examination after completed treatment for malignant neoplasm: Secondary | ICD-10-CM | POA: Diagnosis not present

## 2019-02-08 DIAGNOSIS — R310 Gross hematuria: Secondary | ICD-10-CM | POA: Diagnosis not present

## 2019-03-29 DIAGNOSIS — H1032 Unspecified acute conjunctivitis, left eye: Secondary | ICD-10-CM | POA: Diagnosis not present

## 2019-03-29 DIAGNOSIS — N401 Enlarged prostate with lower urinary tract symptoms: Secondary | ICD-10-CM | POA: Diagnosis not present

## 2019-04-07 ENCOUNTER — Other Ambulatory Visit: Payer: Self-pay

## 2019-04-07 ENCOUNTER — Ambulatory Visit (INDEPENDENT_AMBULATORY_CARE_PROVIDER_SITE_OTHER): Payer: Medicare HMO

## 2019-04-07 ENCOUNTER — Other Ambulatory Visit: Payer: Self-pay | Admitting: Physician Assistant

## 2019-04-07 DIAGNOSIS — N3289 Other specified disorders of bladder: Secondary | ICD-10-CM | POA: Diagnosis not present

## 2019-04-07 DIAGNOSIS — K5900 Constipation, unspecified: Secondary | ICD-10-CM | POA: Diagnosis not present

## 2019-04-07 DIAGNOSIS — Z9049 Acquired absence of other specified parts of digestive tract: Secondary | ICD-10-CM | POA: Diagnosis not present

## 2019-04-07 DIAGNOSIS — R109 Unspecified abdominal pain: Secondary | ICD-10-CM | POA: Diagnosis not present

## 2019-04-07 DIAGNOSIS — N400 Enlarged prostate without lower urinary tract symptoms: Secondary | ICD-10-CM | POA: Diagnosis not present

## 2019-04-07 DIAGNOSIS — Z1389 Encounter for screening for other disorder: Secondary | ICD-10-CM | POA: Diagnosis not present

## 2019-04-07 DIAGNOSIS — G479 Sleep disorder, unspecified: Secondary | ICD-10-CM | POA: Diagnosis not present

## 2019-04-07 MED ORDER — IOHEXOL 300 MG/ML  SOLN
100.0000 mL | Freq: Once | INTRAMUSCULAR | Status: AC | PRN
  Administered 2019-04-07: 16:00:00 100 mL via INTRAVENOUS

## 2019-08-28 DIAGNOSIS — M25562 Pain in left knee: Secondary | ICD-10-CM | POA: Diagnosis not present

## 2019-08-28 DIAGNOSIS — Z23 Encounter for immunization: Secondary | ICD-10-CM | POA: Diagnosis not present

## 2020-02-01 DIAGNOSIS — U071 COVID-19: Secondary | ICD-10-CM | POA: Diagnosis not present

## 2020-06-11 DIAGNOSIS — G479 Sleep disorder, unspecified: Secondary | ICD-10-CM | POA: Diagnosis not present

## 2020-06-11 DIAGNOSIS — R0600 Dyspnea, unspecified: Secondary | ICD-10-CM | POA: Diagnosis not present

## 2020-06-11 DIAGNOSIS — Z2821 Immunization not carried out because of patient refusal: Secondary | ICD-10-CM | POA: Diagnosis not present

## 2020-06-11 DIAGNOSIS — F411 Generalized anxiety disorder: Secondary | ICD-10-CM | POA: Diagnosis not present

## 2020-07-02 ENCOUNTER — Encounter: Payer: Self-pay | Admitting: Cardiology

## 2020-07-02 ENCOUNTER — Other Ambulatory Visit: Payer: Self-pay

## 2020-07-02 ENCOUNTER — Ambulatory Visit: Payer: Medicare HMO | Admitting: Cardiology

## 2020-07-02 VITALS — BP 140/80 | HR 64 | Resp 17 | Ht 74.0 in | Wt 223.0 lb

## 2020-07-02 DIAGNOSIS — Z8616 Personal history of COVID-19: Secondary | ICD-10-CM | POA: Diagnosis not present

## 2020-07-02 DIAGNOSIS — R072 Precordial pain: Secondary | ICD-10-CM

## 2020-07-02 DIAGNOSIS — R0609 Other forms of dyspnea: Secondary | ICD-10-CM

## 2020-07-02 NOTE — Progress Notes (Signed)
Date:  07/02/2020   ID:  Saundra Shelling, DOB 11/11/1951, MRN [id number]  PCP:  Aura Dials, MD  Cardiologist:  Rex Kras, DO, [facility] (established care 07/02/2020)  REASON FOR CONSULT: New onset of dyspnea on exertion and fatigue.   REQUESTING PHYSICIAN:  Aura Dials, [address] [place],  [place] [address]  Chief Complaint  Patient presents with  . Shortness of Breath  . Fatigue  . New Patient (Initial Visit)    HPI  Jack Yu is a 69 y.o. male who presents to the office with a chief complaint of " shortness of breath, fatigue." He is referred to the office at the request of Aura Dials, MD. Patient's past medical history and cardiovascular risk factors include: History of COVID 37 infection (March 2021), history of alcohol use, advanced age.  Patient is referred to the office at the request of his primary care provider due to new onset of dyspnea on exertion and fatigue.  Patient states that he had COVID-19 infection back in March 2021 and since then has had difficulty with breathing.  Patient noted that his having shortness of breath with effort related activities.  Before the Covid pandemic he was able to walk 5 to 7 miles without any effort related symptoms.  Patient states that yesterday he went on for a walk and felt good.  He states he is overall improving but not back to baseline.  At times he is noted chest discomfort as well.  The chest discomfort is located anteriorly and left of the sternum.  Ache-like sensation, 2 out of 10 in intensity, nonradiating, lasts for a few seconds, nonexertional, does not resolve with rest, usually self-limited.  No family history of premature coronary artery disease or sudden cardiac death.  Denies prior history of coronary artery disease, myocardial infarction, congestive heart failure, deep venous thrombosis, pulmonary embolism, stroke, transient ischemic attack.  FUNCTIONAL STATUS: Prior to Jack Yu pandemic he was walking  5-7 mile.    ALLERGIES: No Known Allergies  MEDICATION LIST PRIOR TO VISIT: Current Meds  Medication Sig  . ALPRAZolam (XANAX) 1 MG tablet Take 1 mg by mouth 3 (three) times daily as needed for anxiety.  Marland Kitchen aspirin EC 81 MG tablet Take 81 mg by mouth daily. Swallow whole.  . cetirizine (ZYRTEC) 10 MG tablet Take 10 mg by mouth daily.  . temazepam (RESTORIL) 30 MG capsule Take 30 mg by mouth at bedtime.     PAST MEDICAL HISTORY: Past Medical History:  Diagnosis Date  . Alcoholism ([place])   . Anxiety   . Colon polyps   . Colonic mass    RIGHT AND TRANSVERSE   . Ear drum perforation    RIGHT   . Hematuria    RESOLVED   . Hepatic lesion     PAST SURGICAL HISTORY: Past Surgical History:  Procedure Laterality Date  . COLONOSCOPY    . LAPAROSCOPIC RIGHT COLECTOMY Right 12/20/2018   Procedure: LAPAROSCOPIC EXTENDED RIGHT TRANSVERSE COLECTOMY;  Surgeon: Alphonsa Overall, MD;  Location: [facility];  Service: General;  Laterality: Right;    FAMILY HISTORY: The patient family history includes Cancer in his brother; Drug abuse in his sister; Heart attack in his mother; Heart disease in his father; Heart failure in his father; Hyperlipidemia in his father; Hypertension in his father.  SOCIAL HISTORY:  The patient  reports that he has never smoked. He quit smokeless tobacco use about 4 years ago. He reports previous alcohol use. He  reports previous drug use.  REVIEW OF SYSTEMS: Review of Systems  Constitutional: Negative for chills and fever.  HENT: Negative for hoarse voice and nosebleeds.   Eyes: Negative for discharge, double vision and pain.  Cardiovascular: Positive for chest pain and dyspnea on exertion. Negative for claudication, leg swelling, near-syncope, orthopnea, palpitations, paroxysmal nocturnal dyspnea and syncope.  Respiratory: Negative for hemoptysis and shortness of breath.   Musculoskeletal: Negative for muscle cramps and myalgias.  Gastrointestinal: Negative for abdominal  pain, constipation, diarrhea, hematemesis, hematochezia, melena, nausea and vomiting.  Neurological: Negative for dizziness and light-headedness.    PHYSICAL EXAM: Vitals with BMI 07/02/2020 01/02/2019 01/01/2019  Height 6\' 2"  - -  Weight 223 lbs - -  BMI 57.84 - -  Systolic 696 295 284  Diastolic 80 83 88  Pulse 64 103 91   CONSTITUTIONAL: Well-developed and well-nourished. No acute distress.  SKIN: Skin is warm and dry. No rash noted. No cyanosis. No pallor. No jaundice HEAD: Normocephalic and atraumatic.  EYES: No scleral icterus MOUTH/THROAT: Moist oral membranes.  NECK: No JVD present. No thyromegaly noted. No carotid bruits  LYMPHATIC: No visible cervical adenopathy.  CHEST Normal respiratory effort. No intercostal retractions  LUNGS: Clear to auscultation bilaterally. No stridor. No wheezes. No rales.  CARDIOVASCULAR: Regular rate and rhythm, positive S1-S2, no murmurs rubs or gallops appreciated. ABDOMINAL: No apparent ascites.  EXTREMITIES: No peripheral edema. Varices veins. +2 DP and PT bilateral.  HEMATOLOGIC: No significant bruising NEUROLOGIC: Oriented to person, place, and time. Nonfocal. Normal muscle tone.  PSYCHIATRIC: Normal mood and affect. Normal behavior. Cooperative  CARDIAC DATABASE: EKG: 07/02/2020: Normal sinus rhythm, 68 bpm, normal axis, without underlying ischemia or injury pattern.   Echocardiogram: None  Stress Testing: None  Heart Catheterization: None  LABORATORY DATA: CBC Latest Ref Rng & Units 01/02/2019 12/30/2018 12/28/2018  WBC 4.0 - 10.5 K/uL 14.1(H) 10.9(H) 7.0  Hemoglobin 13.0 - 17.0 g/dL 13.2 13.4 13.0  Hematocrit 39 - 52 % 40.6 41.5 41.1  Platelets 150 - 400 K/uL 384 414(H) 358    CMP Latest Ref Rng & Units 01/02/2019 01/01/2019 12/31/2018  Glucose 70 - 99 mg/dL 107(H) 110(H) 117(H)  BUN 8 - 23 mg/dL 28(H) 29(H) 28(H)  Creatinine 0.61 - 1.24 mg/dL 1.03 0.92 0.95  Sodium 135 - 145 mmol/L 136 136 137  Potassium 3.5 - 5.1 mmol/L 4.2 4.5  4.3  Chloride 98 - 111 mmol/L 104 104 103  CO2 22 - 32 mmol/L 22 24 26   Calcium 8.9 - 10.3 mg/dL 8.6(L) 8.7(L) 8.4(L)  Total Protein 6.5 - 8.1 g/dL 7.6 - -  Total Bilirubin 0.3 - 1.2 mg/dL 0.4 - -  Alkaline Phos 38 - 126 U/L 83 - -  AST 15 - 41 U/L 34 - -  ALT 0 - 44 U/L 88(H) - -    Lipid Panel     Component Value Date/Time   TRIG 131 12/28/2018 0544    No components found for: NTPROBNP No results for input(s): PROBNP in the last 8760 hours. No results for input(s): TSH in the last 8760 hours.  BMP No results for input(s): NA, K, CL, CO2, GLUCOSE, BUN, CREATININE, CALCIUM, GFRNONAA, GFRAA in the last 8760 hours.  HEMOGLOBIN A1C Lab Results  Component Value Date   HGBA1C 5.3 12/15/2018   MPG 105.41 12/15/2018    IMPRESSION:    ICD-10-CM   1. Dyspnea on exertion  R06.00 EKG 12-Lead    PCV MYOCARDIAL PERFUSION WO LEXISCAN    PCV ECHOCARDIOGRAM  COMPLETE  2. Precordial chest pain  R07.2      RECOMMENDATIONS: Jack Yu is a 69 y.o. male whose past medical history and cardiac risk factors include: History of COVID 74 infection (March 2021), history of alcohol use, advanced age.  Dyspnea on exertion:  Patient's been experiencing dyspnea on exertion since his COVID-19 infection March 2020.  Patient states that is slowly improving but does develop if his symptoms are predominantly pulmonary or also cardiac in origin.  Prior to the COVID-19 pandemic he was an avid walker walking 5 to 7 miles per day.  Plan echocardiogram to evaluate for structural heart disease and left ventricular function.  Nuclear stress test recommended to evaluate for reversible ischemia.  Patient will have labs done with his PCP later this week.  Recommend getting a fasting lipid profile.  Patient's blood pressure is elevated at today's office visit but does not carry a diagnosis of hypertension.  I have asked him to keep a log of his blood pressures at home and to either bring it in to our  office or when he sees his PCP to see if pharmacological therapy is warranted.  Precordial chest pain: See above  FINAL MEDICATION LIST END OF ENCOUNTER: No orders of the defined types were placed in this encounter.   There are no discontinued medications.   Current Outpatient Medications:  .  ALPRAZolam (XANAX) 1 MG tablet, Take 1 mg by mouth 3 (three) times daily as needed for anxiety., Disp: , Rfl:  .  aspirin EC 81 MG tablet, Take 81 mg by mouth daily. Swallow whole., Disp: , Rfl:  .  cetirizine (ZYRTEC) 10 MG tablet, Take 10 mg by mouth daily., Disp: , Rfl:  .  temazepam (RESTORIL) 30 MG capsule, Take 30 mg by mouth at bedtime., Disp: , Rfl:   Orders Placed This Encounter  Procedures  . PCV MYOCARDIAL PERFUSION WO LEXISCAN  . EKG 12-Lead  . PCV ECHOCARDIOGRAM COMPLETE    There are no Patient Instructions on file for this visit.   --Continue cardiac medications as reconciled in final medication list. --Return in about 4 weeks (around 07/30/2020) for re-evaluation of shortness of breath., review test results.. Or sooner if needed. --Continue follow-up with your primary care physician regarding the management of your other chronic comorbid conditions.  Patient's questions and concerns were addressed to his satisfaction. He voices understanding of the instructions provided during this encounter.   This note was created using a voice recognition software as a result there may be grammatical errors inadvertently enclosed that do not reflect the nature of this encounter. Every attempt is made to correct such errors.  Rex Kras, [place], [facility]  Pager: [phone] Office: [phone]

## 2020-07-03 ENCOUNTER — Other Ambulatory Visit: Payer: Self-pay

## 2020-07-03 DIAGNOSIS — R0600 Dyspnea, unspecified: Secondary | ICD-10-CM

## 2020-07-03 DIAGNOSIS — R0609 Other forms of dyspnea: Secondary | ICD-10-CM

## 2020-07-10 DIAGNOSIS — R5383 Other fatigue: Secondary | ICD-10-CM | POA: Diagnosis not present

## 2020-07-10 DIAGNOSIS — R109 Unspecified abdominal pain: Secondary | ICD-10-CM | POA: Diagnosis not present

## 2020-07-10 DIAGNOSIS — F411 Generalized anxiety disorder: Secondary | ICD-10-CM | POA: Diagnosis not present

## 2020-07-10 DIAGNOSIS — E785 Hyperlipidemia, unspecified: Secondary | ICD-10-CM | POA: Diagnosis not present

## 2020-07-10 DIAGNOSIS — N401 Enlarged prostate with lower urinary tract symptoms: Secondary | ICD-10-CM | POA: Diagnosis not present

## 2020-07-15 ENCOUNTER — Ambulatory Visit: Payer: Medicare HMO

## 2020-07-15 ENCOUNTER — Other Ambulatory Visit: Payer: Self-pay

## 2020-07-15 DIAGNOSIS — R0609 Other forms of dyspnea: Secondary | ICD-10-CM | POA: Diagnosis not present

## 2020-07-17 ENCOUNTER — Other Ambulatory Visit: Payer: Self-pay

## 2020-07-17 ENCOUNTER — Ambulatory Visit: Payer: Medicare HMO

## 2020-07-17 DIAGNOSIS — R0600 Dyspnea, unspecified: Secondary | ICD-10-CM | POA: Diagnosis not present

## 2020-07-23 ENCOUNTER — Telehealth: Payer: Self-pay

## 2020-07-23 NOTE — Telephone Encounter (Signed)
-----   Message from [place], [place] sent at 07/23/2020 12:05 AM EDT ----- We will review the results at the upcoming office visit.

## 2020-07-23 NOTE — Telephone Encounter (Signed)
Called pt to inform him we will discuss his results on the day of his appt. Pt understood

## 2020-07-31 ENCOUNTER — Encounter: Payer: Self-pay | Admitting: Cardiology

## 2020-07-31 ENCOUNTER — Other Ambulatory Visit: Payer: Self-pay

## 2020-07-31 ENCOUNTER — Ambulatory Visit: Payer: Medicare HMO | Admitting: Cardiology

## 2020-07-31 VITALS — BP 101/65 | HR 74 | Resp 16 | Ht 74.0 in | Wt 228.0 lb

## 2020-07-31 DIAGNOSIS — Z8616 Personal history of COVID-19: Secondary | ICD-10-CM | POA: Diagnosis not present

## 2020-07-31 DIAGNOSIS — R0609 Other forms of dyspnea: Secondary | ICD-10-CM

## 2020-07-31 DIAGNOSIS — Z712 Person consulting for explanation of examination or test findings: Secondary | ICD-10-CM

## 2020-07-31 DIAGNOSIS — E785 Hyperlipidemia, unspecified: Secondary | ICD-10-CM

## 2020-07-31 DIAGNOSIS — R0600 Dyspnea, unspecified: Secondary | ICD-10-CM | POA: Diagnosis not present

## 2020-07-31 NOTE — Progress Notes (Signed)
ID:  Jack Yu, DOB [birth year], MRN [id number]  PCP:  Aura Dials, MD  Cardiologist:  Rex Kras, DO, [facility] (established care 07/02/2020)  Date: 07/31/20 Last Office Visit: 07/02/2020  REQUESTING PHYSICIAN:  Aura Dials, [address],  [place] [address]  Chief Complaint  Patient presents with  . Shortness of Breath  . Follow-up    4 week  . Results    echo and lexi    HPI  Jack Yu is a 69 y.o. male who presents to the office with a chief complaint of " re-evaluate symptoms of shortness of breath and review test results." Patient's past medical history and cardiovascular risk factors include: History of COVID 19 infection (March 2021), history of alcohol use, advanced age.  Patient is referred to the office at the request of his primary care provider due to new onset of dyspnea on exertion and fatigue.  Patient states that he had COVID-19 infection back in March 2021 and since then has had difficulty with breathing.  Before the Covid pandemic he was able to walk 5 to 7 miles without any effort related symptoms.  Last office with the patient noted that his symptoms are improving gradually.    He was referred to cardiology for ischemic evaluation.  Since last visit patient has undergone an echocardiogram which notes a preserved left ventricular systolic function and without any valvular heart disease.  Exercise stress test notes that he exercised 5 minutes, achieved 7 METS, ECG was negative for ischemia and normal myocardial perfusion.  Clinically patient states that he is doing well and overall.  No new symptoms since last visit.  No family history of premature coronary artery disease or sudden cardiac death.  Denies prior history of coronary artery disease, myocardial infarction, congestive heart failure, deep venous thrombosis, pulmonary embolism, stroke, transient ischemic attack.  FUNCTIONAL STATUS: Prior to [place] pandemic he was walking 5-7 mile.     ALLERGIES: No Known Allergies  MEDICATION LIST PRIOR TO VISIT: Current Meds  Medication Sig  . ALPRAZolam (XANAX) 1 MG tablet Take 1 mg by mouth 3 (three) times daily as needed for anxiety.  Marland Kitchen aspirin EC 81 MG tablet Take 81 mg by mouth daily. Swallow whole.  . cetirizine (ZYRTEC) 10 MG tablet Take 10 mg by mouth daily.  . temazepam (RESTORIL) 30 MG capsule Take 30 mg by mouth at bedtime.     PAST MEDICAL HISTORY: Past Medical History:  Diagnosis Date  . Alcoholism ([place])   . Anxiety   . Colon polyps   . Colonic mass    RIGHT AND TRANSVERSE   . Ear drum perforation    RIGHT   . Hematuria    RESOLVED   . Hepatic lesion     PAST SURGICAL HISTORY: Past Surgical History:  Procedure Laterality Date  . COLONOSCOPY    . LAPAROSCOPIC RIGHT COLECTOMY Right 12/20/2018   Procedure: LAPAROSCOPIC EXTENDED RIGHT TRANSVERSE COLECTOMY;  Surgeon: Alphonsa Overall, MD;  Location: [facility];  Service: General;  Laterality: Right;    FAMILY HISTORY: The patient family history includes Cancer in his brother; Drug abuse in his sister; Heart attack in his mother; Heart disease in his father; Heart failure in his father; Hyperlipidemia in his father; Hypertension in his father.  SOCIAL HISTORY:  The patient  reports that he has never smoked. He quit smokeless tobacco use about 4 years ago. He reports previous alcohol use. He reports previous drug use.  REVIEW OF  SYSTEMS: Review of Systems  Constitutional: Negative for chills and fever.  HENT: Negative for hoarse voice and nosebleeds.   Eyes: Negative for discharge, double vision and pain.  Cardiovascular: Negative for chest pain, claudication, dyspnea on exertion, leg swelling, near-syncope, orthopnea, palpitations, paroxysmal nocturnal dyspnea and syncope.  Respiratory: Negative for hemoptysis and shortness of breath.   Musculoskeletal: Negative for muscle cramps and myalgias.  Gastrointestinal: Negative for abdominal pain, constipation,  diarrhea, hematemesis, hematochezia, melena, nausea and vomiting.  Neurological: Negative for dizziness and light-headedness.    PHYSICAL EXAM: Vitals with BMI 07/31/2020 07/02/2020 01/02/2019  Height $Remov'6\' 2"'uYHOLg$  $Remove'6\' 2"'FdeCZrw$  -  Weight 228 lbs 223 lbs -  BMI 53.66 44.03 -  Systolic 474 259 563  Diastolic 65 80 83  Pulse 74 64 103   CONSTITUTIONAL: Well-developed and well-nourished. No acute distress.  SKIN: Skin is warm and dry. No rash noted. No cyanosis. No pallor. No jaundice HEAD: Normocephalic and atraumatic.  EYES: No scleral icterus MOUTH/THROAT: Moist oral membranes.  NECK: No JVD present. No thyromegaly noted. No carotid bruits  LYMPHATIC: No visible cervical adenopathy.  CHEST Normal respiratory effort. No intercostal retractions  LUNGS: Clear to auscultation bilaterally. No stridor. No wheezes. No rales.  CARDIOVASCULAR: Regular rate and rhythm, positive S1-S2, no murmurs rubs or gallops appreciated. ABDOMINAL: No apparent ascites.  EXTREMITIES: No peripheral edema. Varices veins. +2 DP and PT bilateral.  HEMATOLOGIC: No significant bruising NEUROLOGIC: Oriented to person, place, and time. Nonfocal. Normal muscle tone.  PSYCHIATRIC: Normal mood and affect. Normal behavior. Cooperative  CARDIAC DATABASE: EKG: 07/02/2020: Normal sinus rhythm, 68 bpm, normal axis, without underlying ischemia or injury pattern.   Echocardiogram: 07/17/2020:  Left ventricle cavity is normal in size. Mild concentric hypertrophy of  the left ventricle. Normal global wall motion. Normal LV systolic function  with EF 56%. Doppler evidence of grade I (impaired) diastolic dysfunction,  normal LAP.  Mild tricuspid regurgitation.  No evidence of pulmonary hypertension.  Stress Testing: Exercise Myoview stress test 07/15/2020: Exercise nuclear stress test was performed using Bruce protocol. Patient reached 7.0 METS, and 100% of age predicted maximum heart rate. Exercise capacity was low. Chest pain not reported.  Heart rate and hemodynamic response were normal. Stress EKG revealed no ischemic changes. Normal myocardial perfusion. Stress LVEF 66%. Low risk study.  Heart Catheterization: None  LABORATORY DATA: CBC Latest Ref Rng & Units 01/02/2019 12/30/2018 12/28/2018  WBC 4.0 - 10.5 K/uL 14.1(H) 10.9(H) 7.0  Hemoglobin 13.0 - 17.0 g/dL 13.2 13.4 13.0  Hematocrit 39 - 52 % 40.6 41.5 41.1  Platelets 150 - 400 K/uL 384 414(H) 358    CMP Latest Ref Rng & Units 01/02/2019 01/01/2019 12/31/2018  Glucose 70 - 99 mg/dL 107(H) 110(H) 117(H)  BUN 8 - 23 mg/dL 28(H) 29(H) 28(H)  Creatinine 0.61 - 1.24 mg/dL 1.03 0.92 0.95  Sodium 135 - 145 mmol/L 136 136 137  Potassium 3.5 - 5.1 mmol/L 4.2 4.5 4.3  Chloride 98 - 111 mmol/L 104 104 103  CO2 22 - 32 mmol/L $RemoveB'22 24 26  'PJDCOAgL$ Calcium 8.9 - 10.3 mg/dL 8.6(L) 8.7(L) 8.4(L)  Total Protein 6.5 - 8.1 g/dL 7.6 - -  Total Bilirubin 0.3 - 1.2 mg/dL 0.4 - -  Alkaline Phos 38 - 126 U/L 83 - -  AST 15 - 41 U/L 34 - -  ALT 0 - 44 U/L 88(H) - -    Lipid Panel     Component Value Date/Time   TRIG 131 12/28/2018 0544  No components found for: NTPROBNP No results for input(s): PROBNP in the last 8760 hours. No results for input(s): TSH in the last 8760 hours.  BMP No results for input(s): NA, K, CL, CO2, GLUCOSE, BUN, CREATININE, CALCIUM, GFRNONAA, GFRAA in the last 8760 hours.  HEMOGLOBIN A1C Lab Results  Component Value Date   HGBA1C 5.3 12/15/2018   MPG 105.41 12/15/2018    External Labs: Collected: 07/12/2020 Hemoglobin 15.6 g/dL. Creatinine 1.26 mg/dL. eGFR: 57 mL/min per 1.73 m Lipid profile: Total cholesterol 199, triglycerides 125, HDL 34, LDL 141, non-HDL 164  IMPRESSION:    ICD-10-CM   1. Dyspnea on exertion  R06.00   2. History of COVID-19  Z86.16   3. Encounter to discuss test results  Z71.2   4. Dyslipidemia  E78.5      RECOMMENDATIONS: Jack Yu is a 69 y.o. male whose past medical history and cardiac risk factors include: History of  COVID 75 infection (March 2021), history of alcohol use, advanced age.  Dyspnea on exertion: Improving.   Symptoms of effort related dyspnea are improving since last visit.  Ischemic evaluation overall unremarkable as discussed above.  Patient educated on improving his modifiable cardiovascular risk factors.  Continue current medical therapy.  I will see him back in 1 year or sooner if needed.    Dyslipidemia: Outside blood work independently reviewed.  Patient's lipid profile reviewed and currently not at goal.  Patient was recommended to initiate pharmacological therapy.  However, patient wants to continue with lifestyle modifications.  Patient is encouraged to follow-up with his PCP in 6 weeks to re-evaluate his lipid profile after implementing lifestyle changes.  If not at goal recommend pharmacological therapy.  Will defer to primary team at this time.  FINAL MEDICATION LIST END OF ENCOUNTER: No orders of the defined types were placed in this encounter.   There are no discontinued medications.   Current Outpatient Medications:  .  ALPRAZolam (XANAX) 1 MG tablet, Take 1 mg by mouth 3 (three) times daily as needed for anxiety., Disp: , Rfl:  .  aspirin EC 81 MG tablet, Take 81 mg by mouth daily. Swallow whole., Disp: , Rfl:  .  cetirizine (ZYRTEC) 10 MG tablet, Take 10 mg by mouth daily., Disp: , Rfl:  .  temazepam (RESTORIL) 30 MG capsule, Take 30 mg by mouth at bedtime., Disp: , Rfl:   No orders of the defined types were placed in this encounter.  --Continue cardiac medications as reconciled in final medication list. --Return in about 1 year (around 07/31/2021). Or sooner if needed. --Continue follow-up with your primary care physician regarding the management of your other chronic comorbid conditions.  Patient's questions and concerns were addressed to his satisfaction. He voices understanding of the instructions provided during this encounter.   This note was created using a  voice recognition software as a result there may be grammatical errors inadvertently enclosed that do not reflect the nature of this encounter. Every attempt is made to correct such errors.  Rex Kras, [place], [facility]  Pager: [phone] Office: [phone]

## 2020-08-06 NOTE — Progress Notes (Signed)
Patient does not want to try statin therapy right now he prefers dieting and exercise, patient wants to know what he should not eat or drink please advise

## 2020-08-12 NOTE — Progress Notes (Signed)
Patient didn't answer voicemail is full will try again later sch/rma

## 2020-08-12 NOTE — Progress Notes (Signed)
Patient is aware sch/rma

## 2020-09-12 DIAGNOSIS — Z1159 Encounter for screening for other viral diseases: Secondary | ICD-10-CM | POA: Diagnosis not present

## 2020-09-15 IMAGING — DX DG ABDOMEN 2V
3 series · 3 of 3 positions shown · non-contrast
Comparison: December 29, 2018

CLINICAL DATA: Followup small-bowel obstruction

EXAM:
ABDOMEN - 2 VIEW

[abdomen erect]
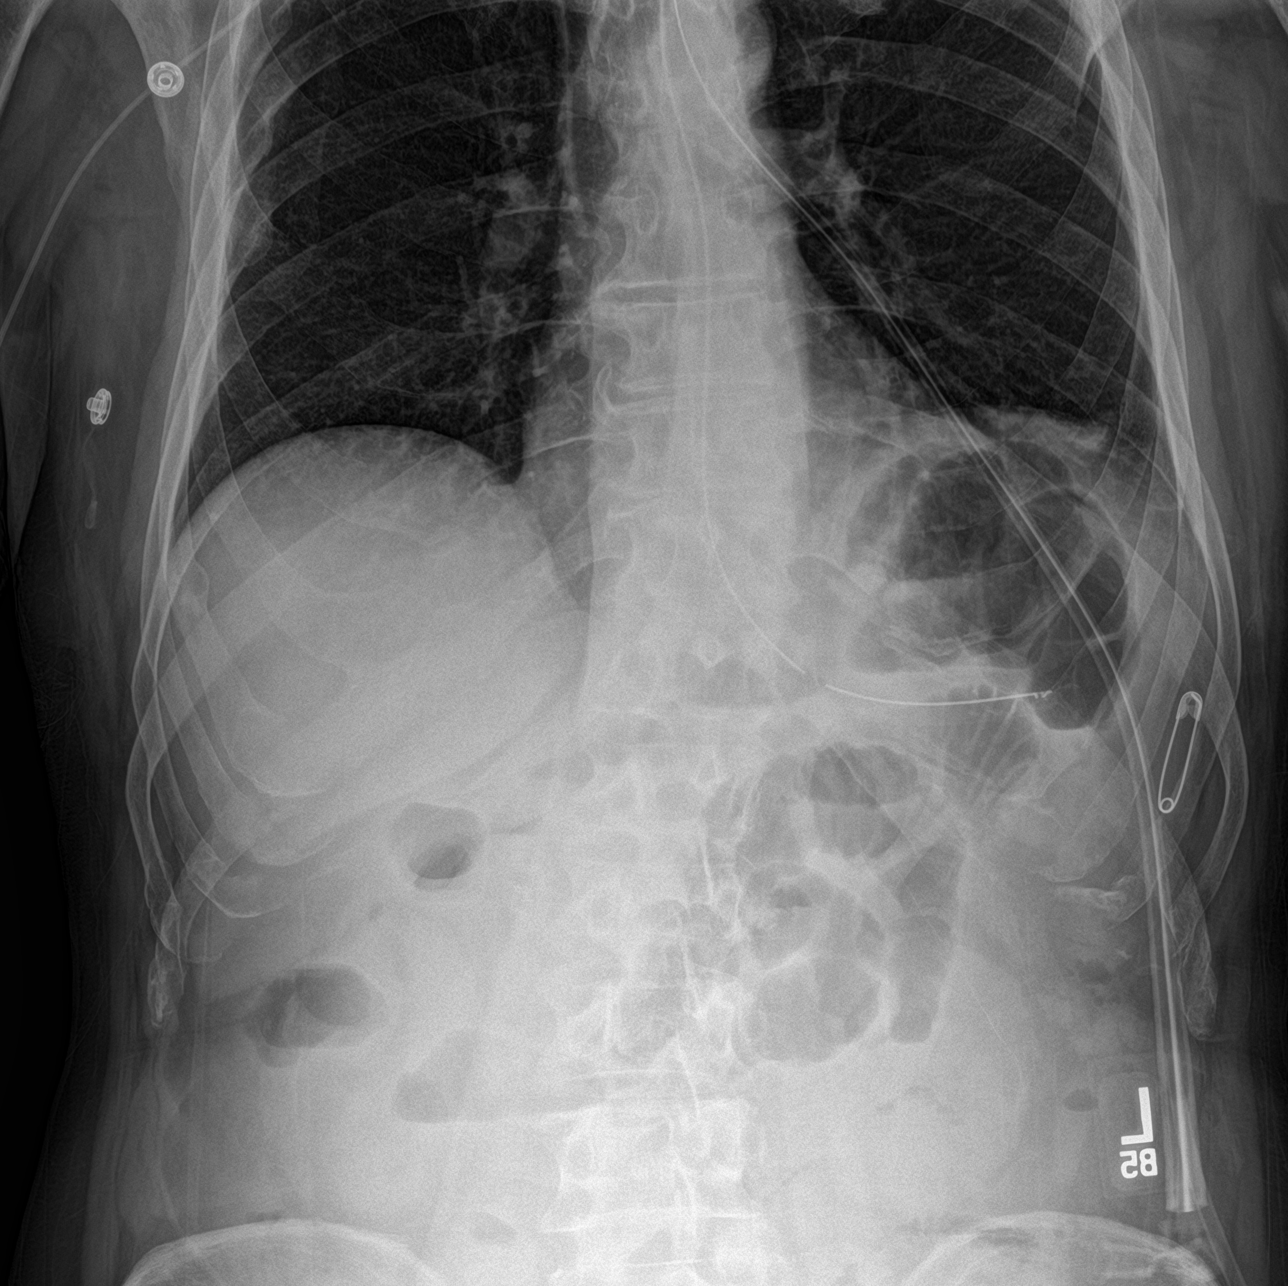

[abdomen supine (1 of 2)]
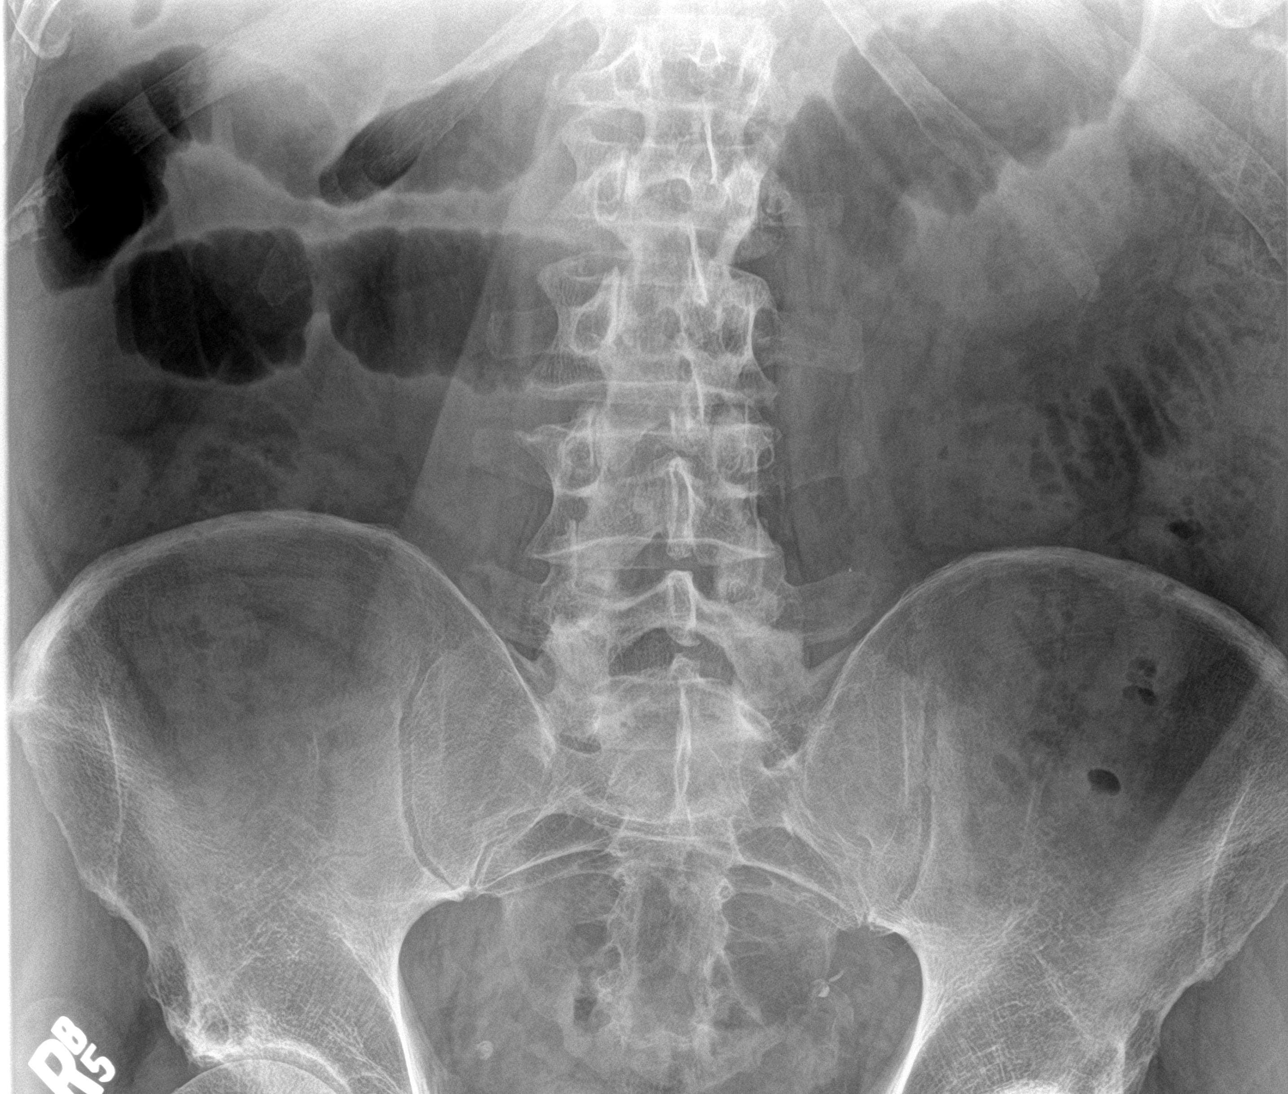

[abdomen supine (2 of 2)]
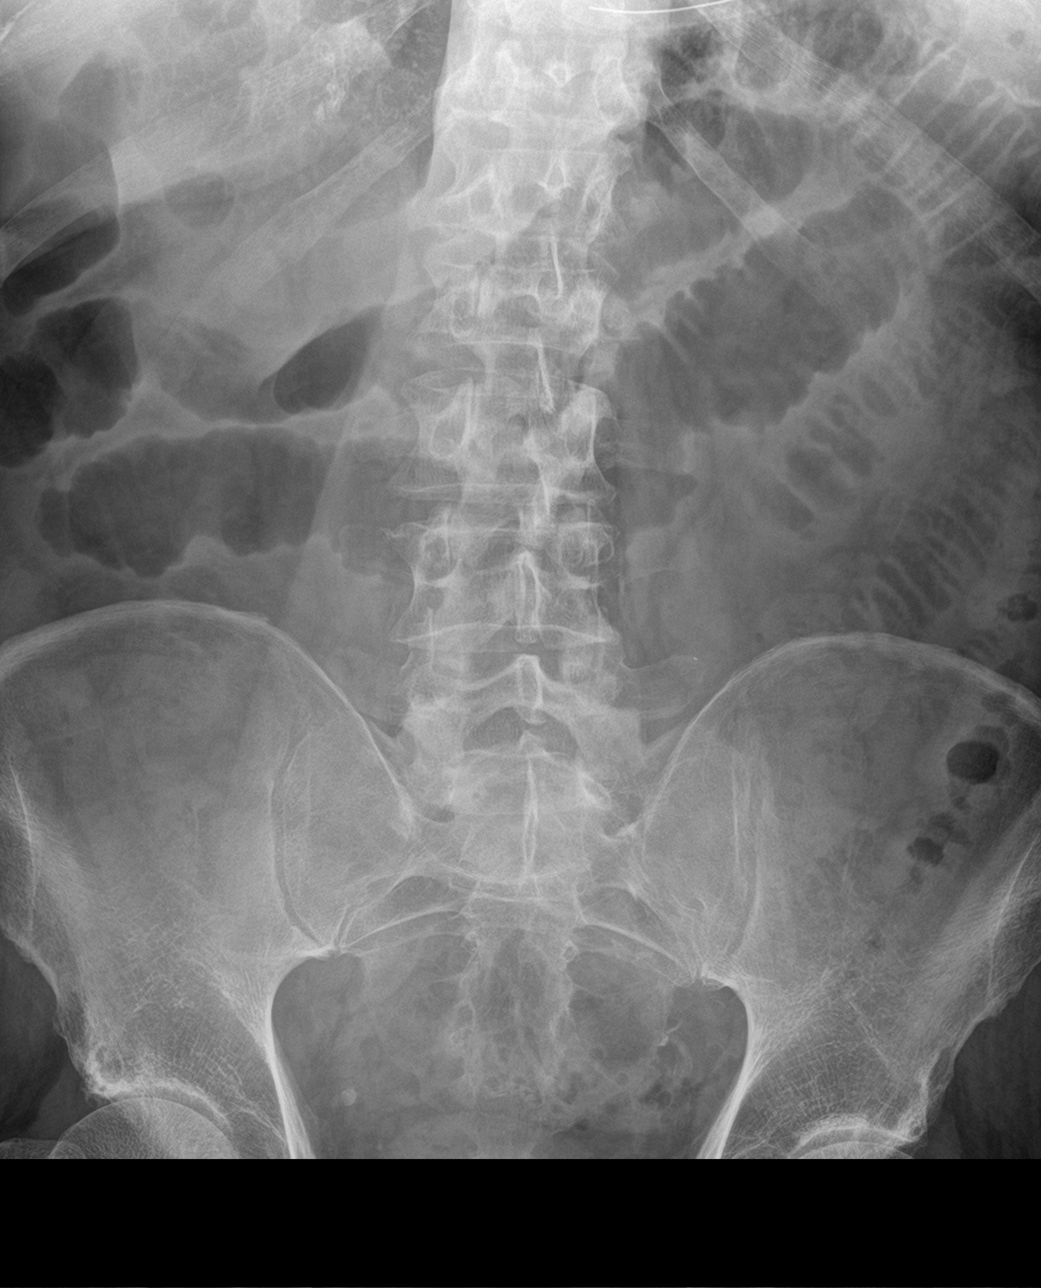

[3 of 3 positions shown; findings below may reference images not displayed]

FINDINGS: Nasogastric tube is identified distal tip in the stomach. There are
several dilated small bowel loops throughout the abdomen. There is
suggestion bowel wall edema which is increased compared to prior
exam. The degree small bowel distension is unchanged.
IMPRESSION: Findings consistent with small bowel obstruction. The degree of
small-bowel distention is unchanged. However, there is suggestion of
bowel wall edema, increased compared to prior exam.

## 2020-09-16 DIAGNOSIS — D123 Benign neoplasm of transverse colon: Secondary | ICD-10-CM | POA: Diagnosis not present

## 2020-09-16 DIAGNOSIS — Z86010 Personal history of colonic polyps: Secondary | ICD-10-CM | POA: Diagnosis not present

## 2020-09-16 DIAGNOSIS — K5730 Diverticulosis of large intestine without perforation or abscess without bleeding: Secondary | ICD-10-CM | POA: Diagnosis not present

## 2020-09-18 DIAGNOSIS — D123 Benign neoplasm of transverse colon: Secondary | ICD-10-CM | POA: Diagnosis not present

## 2020-09-27 DIAGNOSIS — Z23 Encounter for immunization: Secondary | ICD-10-CM | POA: Diagnosis not present

## 2020-09-27 DIAGNOSIS — E785 Hyperlipidemia, unspecified: Secondary | ICD-10-CM | POA: Diagnosis not present

## 2020-09-27 DIAGNOSIS — Z1211 Encounter for screening for malignant neoplasm of colon: Secondary | ICD-10-CM | POA: Diagnosis not present

## 2020-09-27 DIAGNOSIS — Z0000 Encounter for general adult medical examination without abnormal findings: Secondary | ICD-10-CM | POA: Diagnosis not present

## 2020-09-27 DIAGNOSIS — Z125 Encounter for screening for malignant neoplasm of prostate: Secondary | ICD-10-CM | POA: Diagnosis not present

## 2020-09-27 DIAGNOSIS — F419 Anxiety disorder, unspecified: Secondary | ICD-10-CM | POA: Diagnosis not present

## 2020-10-09 DIAGNOSIS — S0011XA Contusion of right eyelid and periocular area, initial encounter: Secondary | ICD-10-CM | POA: Diagnosis not present

## 2020-10-09 DIAGNOSIS — G8911 Acute pain due to trauma: Secondary | ICD-10-CM | POA: Diagnosis not present

## 2020-10-09 DIAGNOSIS — S0181XA Laceration without foreign body of other part of head, initial encounter: Secondary | ICD-10-CM | POA: Diagnosis not present

## 2020-10-09 DIAGNOSIS — F419 Anxiety disorder, unspecified: Secondary | ICD-10-CM | POA: Diagnosis not present

## 2020-10-09 DIAGNOSIS — S060X0A Concussion without loss of consciousness, initial encounter: Secondary | ICD-10-CM | POA: Diagnosis not present

## 2020-10-09 DIAGNOSIS — S0990XA Unspecified injury of head, initial encounter: Secondary | ICD-10-CM | POA: Diagnosis not present

## 2020-10-09 DIAGNOSIS — Z23 Encounter for immunization: Secondary | ICD-10-CM | POA: Diagnosis not present

## 2020-10-09 DIAGNOSIS — S01111A Laceration without foreign body of right eyelid and periocular area, initial encounter: Secondary | ICD-10-CM | POA: Diagnosis not present

## 2020-10-09 DIAGNOSIS — S0510XA Contusion of eyeball and orbital tissues, unspecified eye, initial encounter: Secondary | ICD-10-CM | POA: Diagnosis not present

## 2020-10-18 DIAGNOSIS — S0181XD Laceration without foreign body of other part of head, subsequent encounter: Secondary | ICD-10-CM | POA: Diagnosis not present

## 2020-11-07 DIAGNOSIS — Z23 Encounter for immunization: Secondary | ICD-10-CM | POA: Diagnosis not present

## 2020-11-09 DIAGNOSIS — J09X2 Influenza due to identified novel influenza A virus with other respiratory manifestations: Secondary | ICD-10-CM | POA: Diagnosis not present

## 2020-11-09 DIAGNOSIS — Z03818 Encounter for observation for suspected exposure to other biological agents ruled out: Secondary | ICD-10-CM | POA: Diagnosis not present

## 2020-12-22 IMAGING — CT CT ABDOMEN AND PELVIS WITH CONTRAST
2 of 5 series · 15 of 46 positions shown, 17 images · IV contrast (APPLIED)
Comparison: CT dated 12/28/2018

CLINICAL DATA: One-week of upper abdominal pain with bloating and
constipation. History of colectomy.

EXAM:
CT ABDOMEN AND PELVIS WITH CONTRAST
TECHNIQUE: Multidetector CT imaging of the abdomen and pelvis was performed
using the standard protocol following bolus administration of
intravenous contrast.
CONTRAST:  100mL OMNIPAQUE IOHEXOL 300 MG/ML  SOLN

[Series 2: axial st · axial · 0.94mm/px · z∈[-621,-51]mm · 12 of 128 slices shown, 14 images]
[im 7/128  soft-tissue]
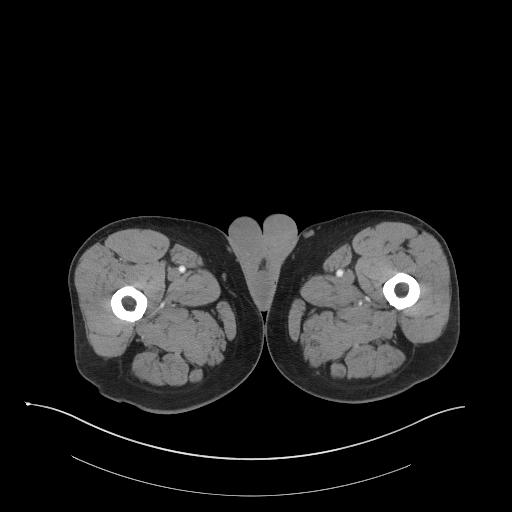
[im 7/128  bone]
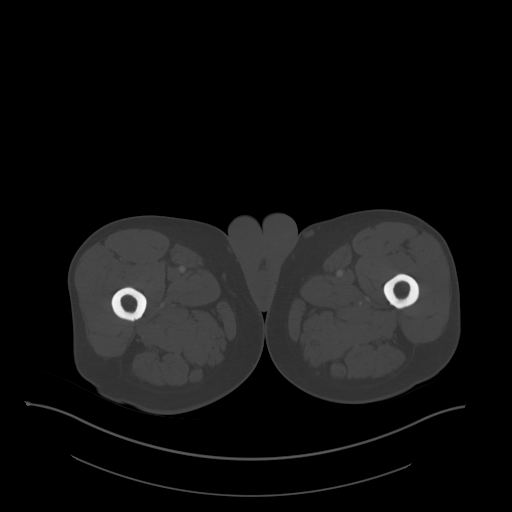
[im 20/128  soft-tissue]
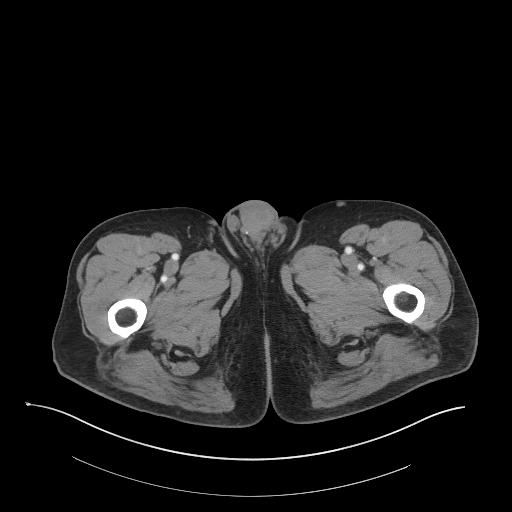
[im 26/128  soft-tissue]
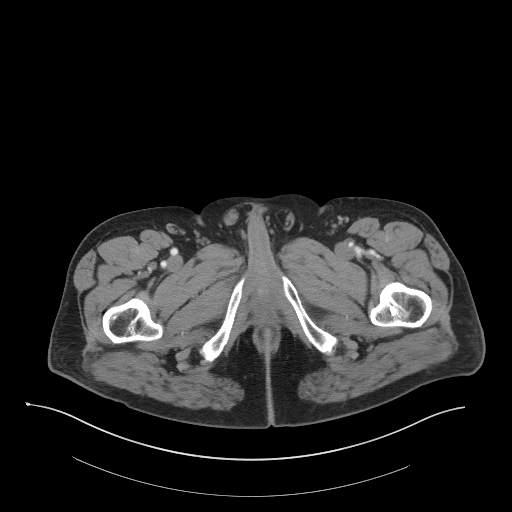
[im 39/128  soft-tissue]
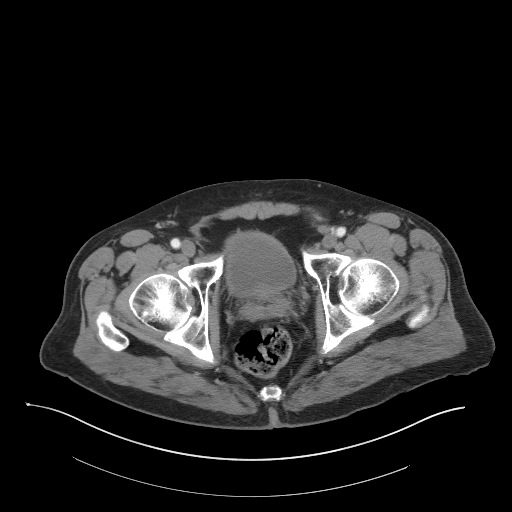
[im 51/128  soft-tissue]
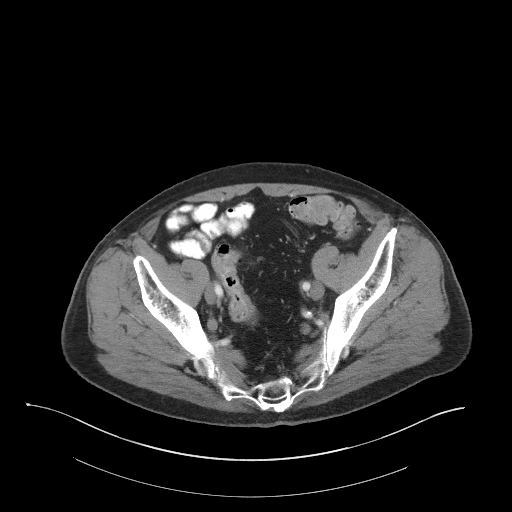
[im 58/128  soft-tissue]
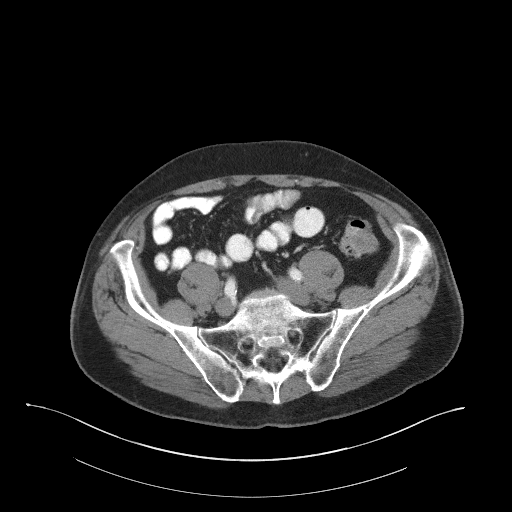
[im 70/128  soft-tissue]
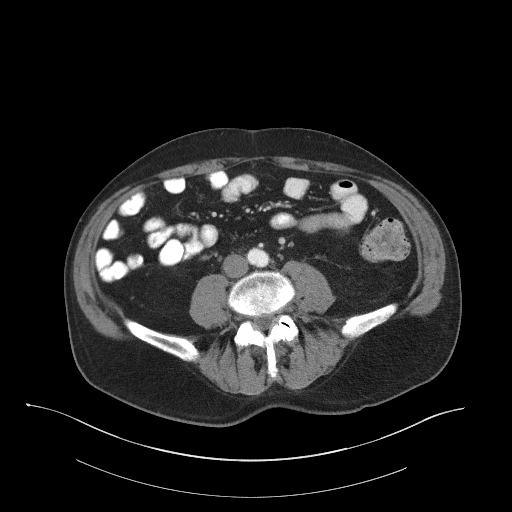
[im 77/128  soft-tissue]
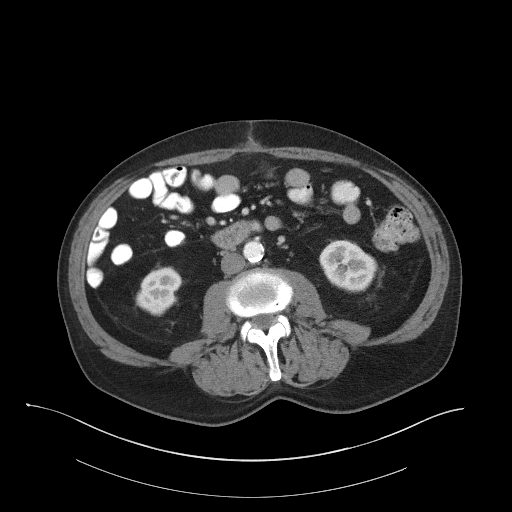
[im 89/128  soft-tissue]
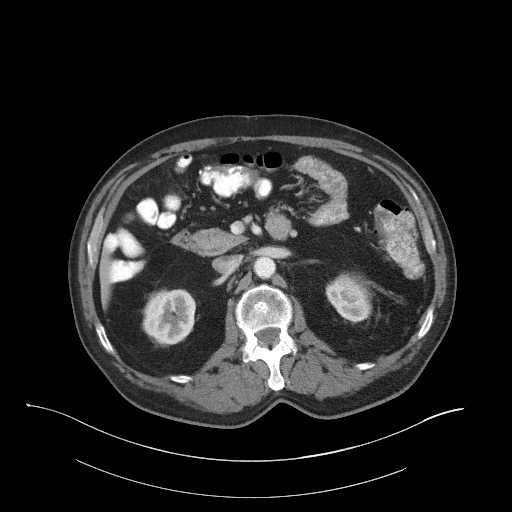
[im 89/128  bone]
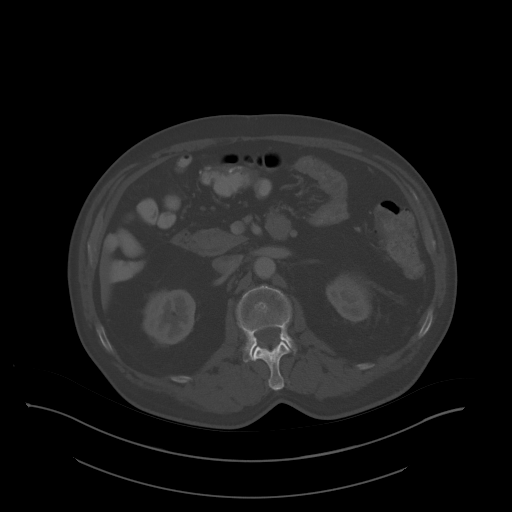
[im 102/128  soft-tissue]
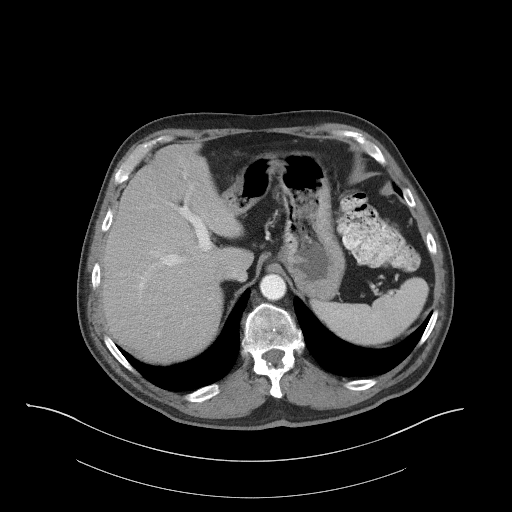
[im 108/128  soft-tissue]
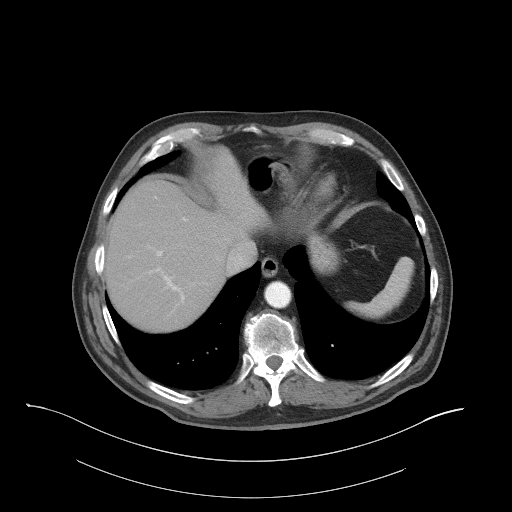
[im 121/128  soft-tissue]
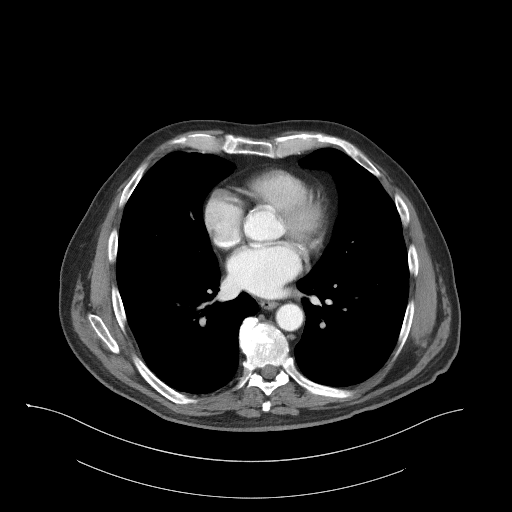

[Series 5: coronal st · coronal · 0.88mm/px · 3 of 105 slices shown]
[im 35/105  soft-tissue]
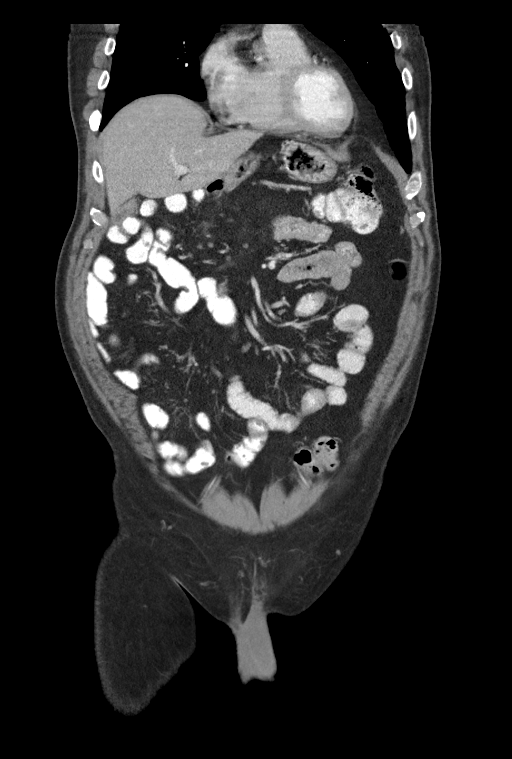
[im 47/105  soft-tissue]
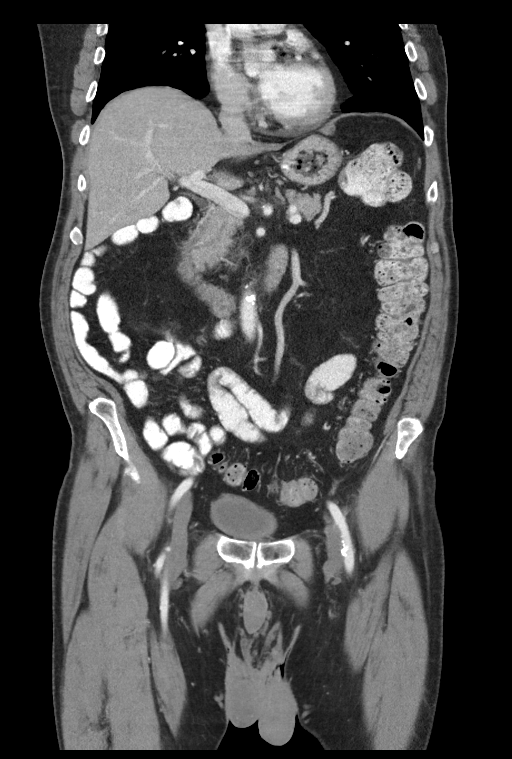
[im 58/105  soft-tissue]
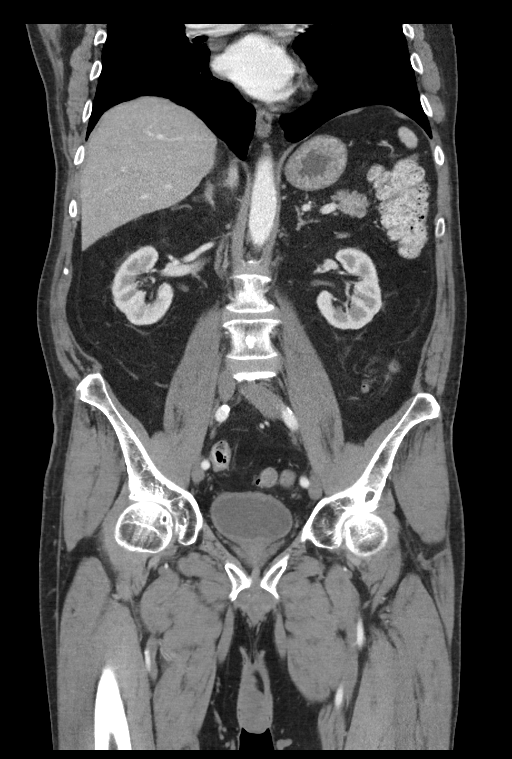

[15 of 46 positions shown; findings below may reference images not displayed]

FINDINGS: Lower chest: No acute abnormality.

Hepatobiliary: A hepatic hemangioma is noted in the left hepatic
lobe. No gallstones, gallbladder wall thickening, or biliary
dilatation.

Pancreas: Unremarkable. No pancreatic ductal dilatation or
surrounding inflammatory changes.

Spleen: Normal in size without focal abnormality.

Adrenals/Urinary Tract: Adrenal glands are unremarkable. Kidneys are
normal, without renal calculi, focal lesion, or hydronephrosis.
There appears to be some mild bladder wall thickening suggestive of
chronic outlet obstruction.

Stomach/Bowel: There is a large amount of stool throughout the
colon. There is scattered colonic diverticula without CT evidence of
diverticulitis. There is an air-fluid level at the level of the
proximal transverse colon. The patient is status post prior right
hemicolectomy. There is no evidence of a small-bowel obstruction.
The appendix is surgically absent.

Vascular/Lymphatic: Aortic atherosclerosis. No enlarged abdominal or
pelvic lymph nodes.

Reproductive: The prostate gland is moderately enlarged.

Other: No abdominal wall hernia or abnormality. No abdominopelvic
ascites.

Musculoskeletal: No acute or significant osseous findings.
IMPRESSION: 1. Large amount of stool throughout the colon. No evidence of a
small-bowel obstruction. The patient is status post right
hemicolectomy. No evidence of colitis.
2. Enlarged prostate gland. Bladder wall thickening which can be
seen in chronic outlet obstruction

Aortic Atherosclerosis ([id number]).

## 2021-03-13 DIAGNOSIS — Z1152 Encounter for screening for COVID-19: Secondary | ICD-10-CM | POA: Diagnosis not present

## 2021-03-13 DIAGNOSIS — J00 Acute nasopharyngitis [common cold]: Secondary | ICD-10-CM | POA: Diagnosis not present

## 2021-03-13 DIAGNOSIS — Z03818 Encounter for observation for suspected exposure to other biological agents ruled out: Secondary | ICD-10-CM | POA: Diagnosis not present

## 2021-03-13 DIAGNOSIS — R059 Cough, unspecified: Secondary | ICD-10-CM | POA: Diagnosis not present

## 2021-03-15 DIAGNOSIS — J9811 Atelectasis: Secondary | ICD-10-CM | POA: Diagnosis not present

## 2021-03-15 DIAGNOSIS — R051 Acute cough: Secondary | ICD-10-CM | POA: Diagnosis not present

## 2021-04-03 DIAGNOSIS — E785 Hyperlipidemia, unspecified: Secondary | ICD-10-CM | POA: Diagnosis not present

## 2021-04-03 DIAGNOSIS — R5383 Other fatigue: Secondary | ICD-10-CM | POA: Diagnosis not present

## 2021-04-03 DIAGNOSIS — F411 Generalized anxiety disorder: Secondary | ICD-10-CM | POA: Diagnosis not present

## 2021-04-03 DIAGNOSIS — F410 Panic disorder [episodic paroxysmal anxiety] without agoraphobia: Secondary | ICD-10-CM | POA: Diagnosis not present

## 2021-04-03 DIAGNOSIS — I700 Atherosclerosis of aorta: Secondary | ICD-10-CM | POA: Diagnosis not present

## 2021-04-03 DIAGNOSIS — G479 Sleep disorder, unspecified: Secondary | ICD-10-CM | POA: Diagnosis not present

## 2021-05-31 DIAGNOSIS — U071 COVID-19: Secondary | ICD-10-CM | POA: Diagnosis not present

## 2021-07-31 ENCOUNTER — Ambulatory Visit: Payer: Medicare HMO | Admitting: Cardiology

## 2021-10-02 DIAGNOSIS — I700 Atherosclerosis of aorta: Secondary | ICD-10-CM | POA: Diagnosis not present

## 2021-10-02 DIAGNOSIS — F410 Panic disorder [episodic paroxysmal anxiety] without agoraphobia: Secondary | ICD-10-CM | POA: Diagnosis not present

## 2021-10-02 DIAGNOSIS — M130 Polyarthritis, unspecified: Secondary | ICD-10-CM | POA: Diagnosis not present

## 2021-10-02 DIAGNOSIS — Z1211 Encounter for screening for malignant neoplasm of colon: Secondary | ICD-10-CM | POA: Diagnosis not present

## 2021-10-02 DIAGNOSIS — Z0000 Encounter for general adult medical examination without abnormal findings: Secondary | ICD-10-CM | POA: Diagnosis not present

## 2021-10-02 DIAGNOSIS — Z125 Encounter for screening for malignant neoplasm of prostate: Secondary | ICD-10-CM | POA: Diagnosis not present

## 2021-10-02 DIAGNOSIS — G479 Sleep disorder, unspecified: Secondary | ICD-10-CM | POA: Diagnosis not present

## 2021-10-02 DIAGNOSIS — Z23 Encounter for immunization: Secondary | ICD-10-CM | POA: Diagnosis not present

## 2021-10-02 DIAGNOSIS — E785 Hyperlipidemia, unspecified: Secondary | ICD-10-CM | POA: Diagnosis not present

## 2022-03-02 DIAGNOSIS — J069 Acute upper respiratory infection, unspecified: Secondary | ICD-10-CM | POA: Diagnosis not present

## 2022-03-02 DIAGNOSIS — F410 Panic disorder [episodic paroxysmal anxiety] without agoraphobia: Secondary | ICD-10-CM | POA: Diagnosis not present

## 2022-03-02 DIAGNOSIS — M25532 Pain in left wrist: Secondary | ICD-10-CM | POA: Diagnosis not present

## 2022-03-02 DIAGNOSIS — G479 Sleep disorder, unspecified: Secondary | ICD-10-CM | POA: Diagnosis not present

## 2022-04-14 DIAGNOSIS — M542 Cervicalgia: Secondary | ICD-10-CM | POA: Diagnosis not present

## 2022-04-14 DIAGNOSIS — M5441 Lumbago with sciatica, right side: Secondary | ICD-10-CM | POA: Diagnosis not present

## 2022-04-14 DIAGNOSIS — M5442 Lumbago with sciatica, left side: Secondary | ICD-10-CM | POA: Diagnosis not present

## 2022-04-21 DIAGNOSIS — M542 Cervicalgia: Secondary | ICD-10-CM | POA: Diagnosis not present

## 2022-04-28 DIAGNOSIS — M542 Cervicalgia: Secondary | ICD-10-CM | POA: Diagnosis not present

## 2022-05-12 DIAGNOSIS — M542 Cervicalgia: Secondary | ICD-10-CM | POA: Diagnosis not present

## 2022-06-10 DIAGNOSIS — S76902A Unspecified injury of unspecified muscles, fascia and tendons at thigh level, left thigh, initial encounter: Secondary | ICD-10-CM | POA: Diagnosis not present

## 2022-06-10 DIAGNOSIS — W1849XA Other slipping, tripping and stumbling without falling, initial encounter: Secondary | ICD-10-CM | POA: Diagnosis not present

## 2022-07-21 DIAGNOSIS — I700 Atherosclerosis of aorta: Secondary | ICD-10-CM | POA: Diagnosis not present

## 2022-07-21 DIAGNOSIS — F410 Panic disorder [episodic paroxysmal anxiety] without agoraphobia: Secondary | ICD-10-CM | POA: Diagnosis not present

## 2022-07-21 DIAGNOSIS — M25512 Pain in left shoulder: Secondary | ICD-10-CM | POA: Diagnosis not present

## 2022-07-21 DIAGNOSIS — G479 Sleep disorder, unspecified: Secondary | ICD-10-CM | POA: Diagnosis not present

## 2022-11-04 DIAGNOSIS — E876 Hypokalemia: Secondary | ICD-10-CM | POA: Diagnosis not present

## 2022-11-04 DIAGNOSIS — F1010 Alcohol abuse, uncomplicated: Secondary | ICD-10-CM | POA: Diagnosis not present

## 2022-11-04 DIAGNOSIS — R58 Hemorrhage, not elsewhere classified: Secondary | ICD-10-CM | POA: Diagnosis not present

## 2022-11-04 DIAGNOSIS — G4700 Insomnia, unspecified: Secondary | ICD-10-CM | POA: Diagnosis not present

## 2022-11-04 DIAGNOSIS — M25552 Pain in left hip: Secondary | ICD-10-CM | POA: Diagnosis not present

## 2022-11-04 DIAGNOSIS — R296 Repeated falls: Secondary | ICD-10-CM | POA: Diagnosis not present

## 2022-11-04 DIAGNOSIS — R030 Elevated blood-pressure reading, without diagnosis of hypertension: Secondary | ICD-10-CM | POA: Diagnosis not present

## 2022-11-04 DIAGNOSIS — W19XXXD Unspecified fall, subsequent encounter: Secondary | ICD-10-CM | POA: Diagnosis not present

## 2022-11-04 DIAGNOSIS — R000 Tachycardia, unspecified: Secondary | ICD-10-CM | POA: Diagnosis not present

## 2022-11-04 DIAGNOSIS — M80852A Other osteoporosis with current pathological fracture, left femur, initial encounter for fracture: Secondary | ICD-10-CM | POA: Diagnosis not present

## 2022-11-04 DIAGNOSIS — M1611 Unilateral primary osteoarthritis, right hip: Secondary | ICD-10-CM | POA: Diagnosis not present

## 2022-11-04 DIAGNOSIS — Z043 Encounter for examination and observation following other accident: Secondary | ICD-10-CM | POA: Diagnosis not present

## 2022-11-04 DIAGNOSIS — F10120 Alcohol abuse with intoxication, uncomplicated: Secondary | ICD-10-CM | POA: Diagnosis not present

## 2022-11-04 DIAGNOSIS — S72002D Fracture of unspecified part of neck of left femur, subsequent encounter for closed fracture with routine healing: Secondary | ICD-10-CM | POA: Diagnosis not present

## 2022-11-04 DIAGNOSIS — M8000XA Age-related osteoporosis with current pathological fracture, unspecified site, initial encounter for fracture: Secondary | ICD-10-CM | POA: Diagnosis not present

## 2022-11-04 DIAGNOSIS — S72012D Unspecified intracapsular fracture of left femur, subsequent encounter for closed fracture with routine healing: Secondary | ICD-10-CM | POA: Diagnosis not present

## 2022-11-04 DIAGNOSIS — Z96642 Presence of left artificial hip joint: Secondary | ICD-10-CM | POA: Diagnosis not present

## 2022-11-04 DIAGNOSIS — E871 Hypo-osmolality and hyponatremia: Secondary | ICD-10-CM | POA: Diagnosis not present

## 2022-11-04 DIAGNOSIS — G250 Essential tremor: Secondary | ICD-10-CM | POA: Diagnosis not present

## 2022-11-04 DIAGNOSIS — M810 Age-related osteoporosis without current pathological fracture: Secondary | ICD-10-CM | POA: Diagnosis not present

## 2022-11-04 DIAGNOSIS — S72012A Unspecified intracapsular fracture of left femur, initial encounter for closed fracture: Secondary | ICD-10-CM | POA: Diagnosis not present

## 2022-11-04 DIAGNOSIS — W19XXXA Unspecified fall, initial encounter: Secondary | ICD-10-CM | POA: Diagnosis not present

## 2022-11-04 DIAGNOSIS — F419 Anxiety disorder, unspecified: Secondary | ICD-10-CM | POA: Diagnosis not present

## 2022-11-04 DIAGNOSIS — S72002A Fracture of unspecified part of neck of left femur, initial encounter for closed fracture: Secondary | ICD-10-CM | POA: Diagnosis not present

## 2022-11-04 DIAGNOSIS — T819XXA Unspecified complication of procedure, initial encounter: Secondary | ICD-10-CM | POA: Diagnosis not present

## 2022-11-04 DIAGNOSIS — F10929 Alcohol use, unspecified with intoxication, unspecified: Secondary | ICD-10-CM | POA: Diagnosis not present

## 2022-11-04 DIAGNOSIS — G8911 Acute pain due to trauma: Secondary | ICD-10-CM | POA: Diagnosis not present

## 2022-11-04 DIAGNOSIS — F1320 Sedative, hypnotic or anxiolytic dependence, uncomplicated: Secondary | ICD-10-CM | POA: Diagnosis not present

## 2022-11-04 DIAGNOSIS — I10 Essential (primary) hypertension: Secondary | ICD-10-CM | POA: Diagnosis not present

## 2022-11-04 DIAGNOSIS — T8149XA Infection following a procedure, other surgical site, initial encounter: Secondary | ICD-10-CM | POA: Diagnosis not present

## 2022-11-04 DIAGNOSIS — Z743 Need for continuous supervision: Secondary | ICD-10-CM | POA: Diagnosis not present

## 2022-11-04 DIAGNOSIS — S72032A Displaced midcervical fracture of left femur, initial encounter for closed fracture: Secondary | ICD-10-CM | POA: Diagnosis not present

## 2022-11-04 DIAGNOSIS — Z471 Aftercare following joint replacement surgery: Secondary | ICD-10-CM | POA: Diagnosis not present

## 2022-11-09 DIAGNOSIS — E871 Hypo-osmolality and hyponatremia: Secondary | ICD-10-CM | POA: Diagnosis not present

## 2022-11-09 DIAGNOSIS — G250 Essential tremor: Secondary | ICD-10-CM | POA: Diagnosis not present

## 2022-11-09 DIAGNOSIS — I1310 Hypertensive heart and chronic kidney disease without heart failure, with stage 1 through stage 4 chronic kidney disease, or unspecified chronic kidney disease: Secondary | ICD-10-CM | POA: Diagnosis not present

## 2022-11-09 DIAGNOSIS — M8000XA Age-related osteoporosis with current pathological fracture, unspecified site, initial encounter for fracture: Secondary | ICD-10-CM | POA: Diagnosis not present

## 2022-11-09 DIAGNOSIS — S72002D Fracture of unspecified part of neck of left femur, subsequent encounter for closed fracture with routine healing: Secondary | ICD-10-CM | POA: Diagnosis not present

## 2022-11-09 DIAGNOSIS — D539 Nutritional anemia, unspecified: Secondary | ICD-10-CM | POA: Diagnosis not present

## 2022-11-09 DIAGNOSIS — M810 Age-related osteoporosis without current pathological fracture: Secondary | ICD-10-CM | POA: Diagnosis not present

## 2022-11-09 DIAGNOSIS — T8149XA Infection following a procedure, other surgical site, initial encounter: Secondary | ICD-10-CM | POA: Diagnosis not present

## 2022-11-09 DIAGNOSIS — Z79899 Other long term (current) drug therapy: Secondary | ICD-10-CM | POA: Diagnosis not present

## 2022-11-09 DIAGNOSIS — W19XXXD Unspecified fall, subsequent encounter: Secondary | ICD-10-CM | POA: Diagnosis not present

## 2022-11-09 DIAGNOSIS — W19XXXA Unspecified fall, initial encounter: Secondary | ICD-10-CM | POA: Diagnosis not present

## 2022-11-09 DIAGNOSIS — S72012D Unspecified intracapsular fracture of left femur, subsequent encounter for closed fracture with routine healing: Secondary | ICD-10-CM | POA: Diagnosis not present

## 2022-11-09 DIAGNOSIS — F5101 Primary insomnia: Secondary | ICD-10-CM | POA: Diagnosis not present

## 2022-11-09 DIAGNOSIS — T819XXA Unspecified complication of procedure, initial encounter: Secondary | ICD-10-CM | POA: Diagnosis not present

## 2022-11-09 DIAGNOSIS — N181 Chronic kidney disease, stage 1: Secondary | ICD-10-CM | POA: Diagnosis not present

## 2022-11-09 DIAGNOSIS — M1990 Unspecified osteoarthritis, unspecified site: Secondary | ICD-10-CM | POA: Diagnosis not present

## 2022-11-09 DIAGNOSIS — Z743 Need for continuous supervision: Secondary | ICD-10-CM | POA: Diagnosis not present

## 2022-11-09 DIAGNOSIS — Z96642 Presence of left artificial hip joint: Secondary | ICD-10-CM | POA: Diagnosis not present

## 2022-11-09 DIAGNOSIS — G4700 Insomnia, unspecified: Secondary | ICD-10-CM | POA: Diagnosis not present

## 2022-11-09 DIAGNOSIS — F1010 Alcohol abuse, uncomplicated: Secondary | ICD-10-CM | POA: Diagnosis not present

## 2022-11-09 DIAGNOSIS — D649 Anemia, unspecified: Secondary | ICD-10-CM | POA: Diagnosis not present

## 2022-11-09 DIAGNOSIS — F419 Anxiety disorder, unspecified: Secondary | ICD-10-CM | POA: Diagnosis not present

## 2022-11-09 DIAGNOSIS — I10 Essential (primary) hypertension: Secondary | ICD-10-CM | POA: Diagnosis not present

## 2022-11-09 DIAGNOSIS — F10288 Alcohol dependence with other alcohol-induced disorder: Secondary | ICD-10-CM | POA: Diagnosis not present

## 2022-11-09 DIAGNOSIS — F411 Generalized anxiety disorder: Secondary | ICD-10-CM | POA: Diagnosis not present

## 2022-11-10 DIAGNOSIS — F419 Anxiety disorder, unspecified: Secondary | ICD-10-CM | POA: Diagnosis not present

## 2022-11-10 DIAGNOSIS — S72002D Fracture of unspecified part of neck of left femur, subsequent encounter for closed fracture with routine healing: Secondary | ICD-10-CM | POA: Diagnosis not present

## 2022-11-10 DIAGNOSIS — I1310 Hypertensive heart and chronic kidney disease without heart failure, with stage 1 through stage 4 chronic kidney disease, or unspecified chronic kidney disease: Secondary | ICD-10-CM | POA: Diagnosis not present

## 2022-11-11 DIAGNOSIS — M810 Age-related osteoporosis without current pathological fracture: Secondary | ICD-10-CM | POA: Diagnosis not present

## 2022-11-11 DIAGNOSIS — F10288 Alcohol dependence with other alcohol-induced disorder: Secondary | ICD-10-CM | POA: Diagnosis not present

## 2022-11-11 DIAGNOSIS — E871 Hypo-osmolality and hyponatremia: Secondary | ICD-10-CM | POA: Diagnosis not present

## 2022-11-11 DIAGNOSIS — I1310 Hypertensive heart and chronic kidney disease without heart failure, with stage 1 through stage 4 chronic kidney disease, or unspecified chronic kidney disease: Secondary | ICD-10-CM | POA: Diagnosis not present

## 2022-11-11 DIAGNOSIS — M1990 Unspecified osteoarthritis, unspecified site: Secondary | ICD-10-CM | POA: Diagnosis not present

## 2022-11-11 DIAGNOSIS — S72002D Fracture of unspecified part of neck of left femur, subsequent encounter for closed fracture with routine healing: Secondary | ICD-10-CM | POA: Diagnosis not present

## 2022-11-11 DIAGNOSIS — N181 Chronic kidney disease, stage 1: Secondary | ICD-10-CM | POA: Diagnosis not present

## 2022-11-11 DIAGNOSIS — F419 Anxiety disorder, unspecified: Secondary | ICD-10-CM | POA: Diagnosis not present

## 2022-11-12 DIAGNOSIS — D649 Anemia, unspecified: Secondary | ICD-10-CM | POA: Diagnosis not present

## 2022-11-12 DIAGNOSIS — N181 Chronic kidney disease, stage 1: Secondary | ICD-10-CM | POA: Diagnosis not present

## 2022-11-12 DIAGNOSIS — I1310 Hypertensive heart and chronic kidney disease without heart failure, with stage 1 through stage 4 chronic kidney disease, or unspecified chronic kidney disease: Secondary | ICD-10-CM | POA: Diagnosis not present

## 2022-11-12 DIAGNOSIS — Z79899 Other long term (current) drug therapy: Secondary | ICD-10-CM | POA: Diagnosis not present

## 2022-11-12 DIAGNOSIS — D539 Nutritional anemia, unspecified: Secondary | ICD-10-CM | POA: Diagnosis not present

## 2022-11-17 DIAGNOSIS — Z79899 Other long term (current) drug therapy: Secondary | ICD-10-CM | POA: Diagnosis not present

## 2022-11-17 DIAGNOSIS — I1310 Hypertensive heart and chronic kidney disease without heart failure, with stage 1 through stage 4 chronic kidney disease, or unspecified chronic kidney disease: Secondary | ICD-10-CM | POA: Diagnosis not present

## 2022-11-17 DIAGNOSIS — S72002D Fracture of unspecified part of neck of left femur, subsequent encounter for closed fracture with routine healing: Secondary | ICD-10-CM | POA: Diagnosis not present

## 2022-11-17 DIAGNOSIS — F419 Anxiety disorder, unspecified: Secondary | ICD-10-CM | POA: Diagnosis not present

## 2022-11-18 DIAGNOSIS — S72002D Fracture of unspecified part of neck of left femur, subsequent encounter for closed fracture with routine healing: Secondary | ICD-10-CM | POA: Diagnosis not present

## 2022-11-18 DIAGNOSIS — F10288 Alcohol dependence with other alcohol-induced disorder: Secondary | ICD-10-CM | POA: Diagnosis not present

## 2022-11-18 DIAGNOSIS — M810 Age-related osteoporosis without current pathological fracture: Secondary | ICD-10-CM | POA: Diagnosis not present

## 2022-11-18 DIAGNOSIS — I1310 Hypertensive heart and chronic kidney disease without heart failure, with stage 1 through stage 4 chronic kidney disease, or unspecified chronic kidney disease: Secondary | ICD-10-CM | POA: Diagnosis not present

## 2022-11-18 DIAGNOSIS — E871 Hypo-osmolality and hyponatremia: Secondary | ICD-10-CM | POA: Diagnosis not present

## 2022-11-18 DIAGNOSIS — D649 Anemia, unspecified: Secondary | ICD-10-CM | POA: Diagnosis not present

## 2022-11-18 DIAGNOSIS — F419 Anxiety disorder, unspecified: Secondary | ICD-10-CM | POA: Diagnosis not present

## 2022-11-20 DIAGNOSIS — F1010 Alcohol abuse, uncomplicated: Secondary | ICD-10-CM | POA: Diagnosis not present

## 2022-11-20 DIAGNOSIS — S72002D Fracture of unspecified part of neck of left femur, subsequent encounter for closed fracture with routine healing: Secondary | ICD-10-CM | POA: Diagnosis not present

## 2022-11-20 DIAGNOSIS — F419 Anxiety disorder, unspecified: Secondary | ICD-10-CM | POA: Diagnosis not present

## 2022-11-20 DIAGNOSIS — D649 Anemia, unspecified: Secondary | ICD-10-CM | POA: Diagnosis not present

## 2022-11-20 DIAGNOSIS — F10288 Alcohol dependence with other alcohol-induced disorder: Secondary | ICD-10-CM | POA: Diagnosis not present

## 2022-11-20 DIAGNOSIS — D539 Nutritional anemia, unspecified: Secondary | ICD-10-CM | POA: Diagnosis not present

## 2022-11-20 DIAGNOSIS — E871 Hypo-osmolality and hyponatremia: Secondary | ICD-10-CM | POA: Diagnosis not present

## 2022-11-20 DIAGNOSIS — M1990 Unspecified osteoarthritis, unspecified site: Secondary | ICD-10-CM | POA: Diagnosis not present

## 2022-11-20 DIAGNOSIS — I1310 Hypertensive heart and chronic kidney disease without heart failure, with stage 1 through stage 4 chronic kidney disease, or unspecified chronic kidney disease: Secondary | ICD-10-CM | POA: Diagnosis not present

## 2022-12-02 DIAGNOSIS — M25552 Pain in left hip: Secondary | ICD-10-CM | POA: Diagnosis not present

## 2022-12-03 DIAGNOSIS — R296 Repeated falls: Secondary | ICD-10-CM | POA: Diagnosis not present

## 2022-12-03 DIAGNOSIS — F1010 Alcohol abuse, uncomplicated: Secondary | ICD-10-CM | POA: Diagnosis not present

## 2022-12-03 DIAGNOSIS — I700 Atherosclerosis of aorta: Secondary | ICD-10-CM | POA: Diagnosis not present

## 2022-12-03 DIAGNOSIS — L989 Disorder of the skin and subcutaneous tissue, unspecified: Secondary | ICD-10-CM | POA: Diagnosis not present

## 2022-12-03 DIAGNOSIS — F410 Panic disorder [episodic paroxysmal anxiety] without agoraphobia: Secondary | ICD-10-CM | POA: Diagnosis not present

## 2022-12-03 DIAGNOSIS — I10 Essential (primary) hypertension: Secondary | ICD-10-CM | POA: Diagnosis not present

## 2022-12-03 DIAGNOSIS — S72002D Fracture of unspecified part of neck of left femur, subsequent encounter for closed fracture with routine healing: Secondary | ICD-10-CM | POA: Diagnosis not present

## 2022-12-30 DIAGNOSIS — M25552 Pain in left hip: Secondary | ICD-10-CM | POA: Diagnosis not present

## 2022-12-31 DIAGNOSIS — L814 Other melanin hyperpigmentation: Secondary | ICD-10-CM | POA: Diagnosis not present

## 2022-12-31 DIAGNOSIS — L578 Other skin changes due to chronic exposure to nonionizing radiation: Secondary | ICD-10-CM | POA: Diagnosis not present

## 2022-12-31 DIAGNOSIS — C44622 Squamous cell carcinoma of skin of right upper limb, including shoulder: Secondary | ICD-10-CM | POA: Diagnosis not present

## 2023-01-11 DIAGNOSIS — C44622 Squamous cell carcinoma of skin of right upper limb, including shoulder: Secondary | ICD-10-CM | POA: Diagnosis not present

## 2023-01-19 DIAGNOSIS — G479 Sleep disorder, unspecified: Secondary | ICD-10-CM | POA: Diagnosis not present

## 2023-01-19 DIAGNOSIS — I10 Essential (primary) hypertension: Secondary | ICD-10-CM | POA: Diagnosis not present

## 2023-01-19 DIAGNOSIS — R296 Repeated falls: Secondary | ICD-10-CM | POA: Diagnosis not present

## 2023-01-19 DIAGNOSIS — M4606 Spinal enthesopathy, lumbar region: Secondary | ICD-10-CM | POA: Diagnosis not present

## 2023-01-19 DIAGNOSIS — R310 Gross hematuria: Secondary | ICD-10-CM | POA: Diagnosis not present

## 2023-01-19 DIAGNOSIS — Z0000 Encounter for general adult medical examination without abnormal findings: Secondary | ICD-10-CM | POA: Diagnosis not present

## 2023-01-19 DIAGNOSIS — I700 Atherosclerosis of aorta: Secondary | ICD-10-CM | POA: Diagnosis not present

## 2023-01-19 DIAGNOSIS — F410 Panic disorder [episodic paroxysmal anxiety] without agoraphobia: Secondary | ICD-10-CM | POA: Diagnosis not present

## 2023-01-19 DIAGNOSIS — Z125 Encounter for screening for malignant neoplasm of prostate: Secondary | ICD-10-CM | POA: Diagnosis not present

## 2023-02-10 DIAGNOSIS — R361 Hematospermia: Secondary | ICD-10-CM | POA: Diagnosis not present

## 2023-02-10 DIAGNOSIS — R310 Gross hematuria: Secondary | ICD-10-CM | POA: Diagnosis not present

## 2023-03-15 DIAGNOSIS — H35363 Drusen (degenerative) of macula, bilateral: Secondary | ICD-10-CM | POA: Diagnosis not present

## 2023-03-15 DIAGNOSIS — H526 Other disorders of refraction: Secondary | ICD-10-CM | POA: Diagnosis not present

## 2023-03-15 DIAGNOSIS — H524 Presbyopia: Secondary | ICD-10-CM | POA: Diagnosis not present

## 2023-03-15 DIAGNOSIS — H25813 Combined forms of age-related cataract, bilateral: Secondary | ICD-10-CM | POA: Diagnosis not present

## 2023-03-15 DIAGNOSIS — H43812 Vitreous degeneration, left eye: Secondary | ICD-10-CM | POA: Diagnosis not present

## 2023-03-15 DIAGNOSIS — H04123 Dry eye syndrome of bilateral lacrimal glands: Secondary | ICD-10-CM | POA: Diagnosis not present

## 2023-06-08 DIAGNOSIS — M4606 Spinal enthesopathy, lumbar region: Secondary | ICD-10-CM | POA: Diagnosis not present

## 2023-06-08 DIAGNOSIS — G479 Sleep disorder, unspecified: Secondary | ICD-10-CM | POA: Diagnosis not present

## 2023-06-08 DIAGNOSIS — F410 Panic disorder [episodic paroxysmal anxiety] without agoraphobia: Secondary | ICD-10-CM | POA: Diagnosis not present

## 2023-06-08 DIAGNOSIS — R296 Repeated falls: Secondary | ICD-10-CM | POA: Diagnosis not present

## 2023-10-22 DIAGNOSIS — M4606 Spinal enthesopathy, lumbar region: Secondary | ICD-10-CM | POA: Diagnosis not present

## 2023-10-22 DIAGNOSIS — F410 Panic disorder [episodic paroxysmal anxiety] without agoraphobia: Secondary | ICD-10-CM | POA: Diagnosis not present

## 2023-10-22 DIAGNOSIS — G479 Sleep disorder, unspecified: Secondary | ICD-10-CM | POA: Diagnosis not present

## 2024-03-24 DIAGNOSIS — Z125 Encounter for screening for malignant neoplasm of prostate: Secondary | ICD-10-CM | POA: Diagnosis not present

## 2024-03-24 DIAGNOSIS — G479 Sleep disorder, unspecified: Secondary | ICD-10-CM | POA: Diagnosis not present

## 2024-03-24 DIAGNOSIS — E785 Hyperlipidemia, unspecified: Secondary | ICD-10-CM | POA: Diagnosis not present

## 2024-03-24 DIAGNOSIS — I10 Essential (primary) hypertension: Secondary | ICD-10-CM | POA: Diagnosis not present

## 2024-03-24 DIAGNOSIS — I700 Atherosclerosis of aorta: Secondary | ICD-10-CM | POA: Diagnosis not present

## 2024-03-24 DIAGNOSIS — Z0000 Encounter for general adult medical examination without abnormal findings: Secondary | ICD-10-CM | POA: Diagnosis not present

## 2024-03-24 DIAGNOSIS — M4606 Spinal enthesopathy, lumbar region: Secondary | ICD-10-CM | POA: Diagnosis not present

## 2024-03-24 DIAGNOSIS — F410 Panic disorder [episodic paroxysmal anxiety] without agoraphobia: Secondary | ICD-10-CM | POA: Diagnosis not present

## 2024-03-24 DIAGNOSIS — Z1211 Encounter for screening for malignant neoplasm of colon: Secondary | ICD-10-CM | POA: Diagnosis not present

## 2024-04-03 DIAGNOSIS — S0101XA Laceration without foreign body of scalp, initial encounter: Secondary | ICD-10-CM | POA: Diagnosis not present

## 2024-05-08 DIAGNOSIS — S72001D Fracture of unspecified part of neck of right femur, subsequent encounter for closed fracture with routine healing: Secondary | ICD-10-CM | POA: Diagnosis not present

## 2024-05-08 DIAGNOSIS — S728X1A Other fracture of right femur, initial encounter for closed fracture: Secondary | ICD-10-CM | POA: Diagnosis not present

## 2024-05-08 DIAGNOSIS — J309 Allergic rhinitis, unspecified: Secondary | ICD-10-CM | POA: Diagnosis not present

## 2024-05-08 DIAGNOSIS — M810 Age-related osteoporosis without current pathological fracture: Secondary | ICD-10-CM | POA: Diagnosis not present

## 2024-05-08 DIAGNOSIS — E871 Hypo-osmolality and hyponatremia: Secondary | ICD-10-CM | POA: Diagnosis not present

## 2024-05-08 DIAGNOSIS — Z87891 Personal history of nicotine dependence: Secondary | ICD-10-CM | POA: Diagnosis not present

## 2024-05-08 DIAGNOSIS — W19XXXA Unspecified fall, initial encounter: Secondary | ICD-10-CM | POA: Diagnosis not present

## 2024-05-08 DIAGNOSIS — S72002D Fracture of unspecified part of neck of left femur, subsequent encounter for closed fracture with routine healing: Secondary | ICD-10-CM | POA: Diagnosis not present

## 2024-05-08 DIAGNOSIS — W19XXXD Unspecified fall, subsequent encounter: Secondary | ICD-10-CM | POA: Diagnosis not present

## 2024-05-08 DIAGNOSIS — Z6828 Body mass index (BMI) 28.0-28.9, adult: Secondary | ICD-10-CM | POA: Diagnosis not present

## 2024-05-08 DIAGNOSIS — Z7401 Bed confinement status: Secondary | ICD-10-CM | POA: Diagnosis not present

## 2024-05-08 DIAGNOSIS — S42201A Unspecified fracture of upper end of right humerus, initial encounter for closed fracture: Secondary | ICD-10-CM | POA: Diagnosis not present

## 2024-05-08 DIAGNOSIS — S72001A Fracture of unspecified part of neck of right femur, initial encounter for closed fracture: Secondary | ICD-10-CM | POA: Diagnosis not present

## 2024-05-08 DIAGNOSIS — E669 Obesity, unspecified: Secondary | ICD-10-CM | POA: Diagnosis not present

## 2024-05-08 DIAGNOSIS — S199XXA Unspecified injury of neck, initial encounter: Secondary | ICD-10-CM | POA: Diagnosis not present

## 2024-05-08 DIAGNOSIS — R296 Repeated falls: Secondary | ICD-10-CM | POA: Diagnosis not present

## 2024-05-08 DIAGNOSIS — Z471 Aftercare following joint replacement surgery: Secondary | ICD-10-CM | POA: Diagnosis not present

## 2024-05-08 DIAGNOSIS — S0990XA Unspecified injury of head, initial encounter: Secondary | ICD-10-CM | POA: Diagnosis not present

## 2024-05-08 DIAGNOSIS — Z7409 Other reduced mobility: Secondary | ICD-10-CM | POA: Diagnosis not present

## 2024-05-08 DIAGNOSIS — F419 Anxiety disorder, unspecified: Secondary | ICD-10-CM | POA: Diagnosis not present

## 2024-05-08 DIAGNOSIS — F1010 Alcohol abuse, uncomplicated: Secondary | ICD-10-CM | POA: Diagnosis not present

## 2024-05-08 DIAGNOSIS — F1020 Alcohol dependence, uncomplicated: Secondary | ICD-10-CM | POA: Diagnosis not present

## 2024-05-08 DIAGNOSIS — N401 Enlarged prostate with lower urinary tract symptoms: Secondary | ICD-10-CM | POA: Diagnosis not present

## 2024-05-08 DIAGNOSIS — I10 Essential (primary) hypertension: Secondary | ICD-10-CM | POA: Diagnosis not present

## 2024-05-08 DIAGNOSIS — S7291XA Unspecified fracture of right femur, initial encounter for closed fracture: Secondary | ICD-10-CM | POA: Diagnosis not present

## 2024-05-08 DIAGNOSIS — E876 Hypokalemia: Secondary | ICD-10-CM | POA: Diagnosis not present

## 2024-05-08 DIAGNOSIS — M25559 Pain in unspecified hip: Secondary | ICD-10-CM | POA: Diagnosis not present

## 2024-05-08 DIAGNOSIS — D649 Anemia, unspecified: Secondary | ICD-10-CM | POA: Diagnosis not present

## 2024-05-08 DIAGNOSIS — Z96641 Presence of right artificial hip joint: Secondary | ICD-10-CM | POA: Diagnosis not present

## 2024-05-09 DIAGNOSIS — S728X1A Other fracture of right femur, initial encounter for closed fracture: Secondary | ICD-10-CM | POA: Diagnosis not present

## 2024-05-10 DIAGNOSIS — S728X1A Other fracture of right femur, initial encounter for closed fracture: Secondary | ICD-10-CM | POA: Diagnosis not present

## 2024-05-11 DIAGNOSIS — S728X1A Other fracture of right femur, initial encounter for closed fracture: Secondary | ICD-10-CM | POA: Diagnosis not present

## 2024-05-12 DIAGNOSIS — M810 Age-related osteoporosis without current pathological fracture: Secondary | ICD-10-CM | POA: Diagnosis not present

## 2024-05-12 DIAGNOSIS — Z7901 Long term (current) use of anticoagulants: Secondary | ICD-10-CM | POA: Diagnosis not present

## 2024-05-12 DIAGNOSIS — I10 Essential (primary) hypertension: Secondary | ICD-10-CM | POA: Diagnosis not present

## 2024-05-12 DIAGNOSIS — Z9889 Other specified postprocedural states: Secondary | ICD-10-CM | POA: Diagnosis not present

## 2024-05-12 DIAGNOSIS — Z9181 History of falling: Secondary | ICD-10-CM | POA: Diagnosis not present

## 2024-05-12 DIAGNOSIS — W19XXXD Unspecified fall, subsequent encounter: Secondary | ICD-10-CM | POA: Diagnosis not present

## 2024-05-12 DIAGNOSIS — J309 Allergic rhinitis, unspecified: Secondary | ICD-10-CM | POA: Diagnosis not present

## 2024-05-12 DIAGNOSIS — E876 Hypokalemia: Secondary | ICD-10-CM | POA: Diagnosis not present

## 2024-05-12 DIAGNOSIS — N401 Enlarged prostate with lower urinary tract symptoms: Secondary | ICD-10-CM | POA: Diagnosis not present

## 2024-05-12 DIAGNOSIS — R5381 Other malaise: Secondary | ICD-10-CM | POA: Diagnosis not present

## 2024-05-12 DIAGNOSIS — T8484XA Pain due to internal orthopedic prosthetic devices, implants and grafts, initial encounter: Secondary | ICD-10-CM | POA: Diagnosis not present

## 2024-05-12 DIAGNOSIS — F1020 Alcohol dependence, uncomplicated: Secondary | ICD-10-CM | POA: Diagnosis not present

## 2024-05-12 DIAGNOSIS — S728X1A Other fracture of right femur, initial encounter for closed fracture: Secondary | ICD-10-CM | POA: Diagnosis not present

## 2024-05-12 DIAGNOSIS — F419 Anxiety disorder, unspecified: Secondary | ICD-10-CM | POA: Diagnosis not present

## 2024-05-12 DIAGNOSIS — G252 Other specified forms of tremor: Secondary | ICD-10-CM | POA: Diagnosis not present

## 2024-05-12 DIAGNOSIS — S7291XA Unspecified fracture of right femur, initial encounter for closed fracture: Secondary | ICD-10-CM | POA: Diagnosis not present

## 2024-05-12 DIAGNOSIS — M25551 Pain in right hip: Secondary | ICD-10-CM | POA: Diagnosis not present

## 2024-05-12 DIAGNOSIS — E871 Hypo-osmolality and hyponatremia: Secondary | ICD-10-CM | POA: Diagnosis not present

## 2024-05-12 DIAGNOSIS — Z96641 Presence of right artificial hip joint: Secondary | ICD-10-CM | POA: Diagnosis not present

## 2024-05-12 DIAGNOSIS — G8918 Other acute postprocedural pain: Secondary | ICD-10-CM | POA: Diagnosis not present

## 2024-05-12 DIAGNOSIS — Z7401 Bed confinement status: Secondary | ICD-10-CM | POA: Diagnosis not present

## 2024-05-12 DIAGNOSIS — N400 Enlarged prostate without lower urinary tract symptoms: Secondary | ICD-10-CM | POA: Diagnosis not present

## 2024-05-12 DIAGNOSIS — F1090 Alcohol use, unspecified, uncomplicated: Secondary | ICD-10-CM | POA: Diagnosis not present

## 2024-05-12 DIAGNOSIS — R52 Pain, unspecified: Secondary | ICD-10-CM | POA: Diagnosis not present

## 2024-05-12 DIAGNOSIS — S72001D Fracture of unspecified part of neck of right femur, subsequent encounter for closed fracture with routine healing: Secondary | ICD-10-CM | POA: Diagnosis not present

## 2024-05-12 DIAGNOSIS — Z471 Aftercare following joint replacement surgery: Secondary | ICD-10-CM | POA: Diagnosis not present

## 2024-05-12 DIAGNOSIS — S72001A Fracture of unspecified part of neck of right femur, initial encounter for closed fracture: Secondary | ICD-10-CM | POA: Diagnosis not present

## 2024-05-12 DIAGNOSIS — F1010 Alcohol abuse, uncomplicated: Secondary | ICD-10-CM | POA: Diagnosis not present

## 2024-05-14 DIAGNOSIS — T8484XA Pain due to internal orthopedic prosthetic devices, implants and grafts, initial encounter: Secondary | ICD-10-CM | POA: Diagnosis not present

## 2024-05-14 DIAGNOSIS — Z96641 Presence of right artificial hip joint: Secondary | ICD-10-CM | POA: Diagnosis not present

## 2024-05-14 DIAGNOSIS — M25551 Pain in right hip: Secondary | ICD-10-CM | POA: Diagnosis not present

## 2024-05-14 DIAGNOSIS — Z9889 Other specified postprocedural states: Secondary | ICD-10-CM | POA: Diagnosis not present

## 2024-05-14 DIAGNOSIS — Z471 Aftercare following joint replacement surgery: Secondary | ICD-10-CM | POA: Diagnosis not present

## 2024-05-14 DIAGNOSIS — G8918 Other acute postprocedural pain: Secondary | ICD-10-CM | POA: Diagnosis not present

## 2024-05-15 DIAGNOSIS — S72001D Fracture of unspecified part of neck of right femur, subsequent encounter for closed fracture with routine healing: Secondary | ICD-10-CM | POA: Diagnosis not present

## 2024-05-15 DIAGNOSIS — I10 Essential (primary) hypertension: Secondary | ICD-10-CM | POA: Diagnosis not present

## 2024-05-15 DIAGNOSIS — Z7901 Long term (current) use of anticoagulants: Secondary | ICD-10-CM | POA: Diagnosis not present

## 2024-05-15 DIAGNOSIS — F1090 Alcohol use, unspecified, uncomplicated: Secondary | ICD-10-CM | POA: Diagnosis not present

## 2024-05-15 DIAGNOSIS — G252 Other specified forms of tremor: Secondary | ICD-10-CM | POA: Diagnosis not present

## 2024-05-15 DIAGNOSIS — Z9181 History of falling: Secondary | ICD-10-CM | POA: Diagnosis not present

## 2024-05-22 DIAGNOSIS — I10 Essential (primary) hypertension: Secondary | ICD-10-CM | POA: Diagnosis not present

## 2024-05-22 DIAGNOSIS — N400 Enlarged prostate without lower urinary tract symptoms: Secondary | ICD-10-CM | POA: Diagnosis not present

## 2024-05-22 DIAGNOSIS — R52 Pain, unspecified: Secondary | ICD-10-CM | POA: Diagnosis not present

## 2024-05-22 DIAGNOSIS — F1090 Alcohol use, unspecified, uncomplicated: Secondary | ICD-10-CM | POA: Diagnosis not present

## 2024-05-22 DIAGNOSIS — R5381 Other malaise: Secondary | ICD-10-CM | POA: Diagnosis not present

## 2024-05-24 DIAGNOSIS — N400 Enlarged prostate without lower urinary tract symptoms: Secondary | ICD-10-CM | POA: Diagnosis not present

## 2024-05-24 DIAGNOSIS — F1090 Alcohol use, unspecified, uncomplicated: Secondary | ICD-10-CM | POA: Diagnosis not present

## 2024-05-24 DIAGNOSIS — S72001D Fracture of unspecified part of neck of right femur, subsequent encounter for closed fracture with routine healing: Secondary | ICD-10-CM | POA: Diagnosis not present

## 2024-05-24 DIAGNOSIS — I10 Essential (primary) hypertension: Secondary | ICD-10-CM | POA: Diagnosis not present

## 2024-05-26 DIAGNOSIS — M25551 Pain in right hip: Secondary | ICD-10-CM | POA: Diagnosis not present

## 2024-05-31 DIAGNOSIS — F411 Generalized anxiety disorder: Secondary | ICD-10-CM | POA: Diagnosis not present

## 2024-05-31 DIAGNOSIS — I10 Essential (primary) hypertension: Secondary | ICD-10-CM | POA: Diagnosis not present

## 2024-05-31 DIAGNOSIS — M80051G Age-related osteoporosis with current pathological fracture, right femur, subsequent encounter for fracture with delayed healing: Secondary | ICD-10-CM | POA: Diagnosis not present

## 2024-05-31 DIAGNOSIS — F1020 Alcohol dependence, uncomplicated: Secondary | ICD-10-CM | POA: Diagnosis not present

## 2024-05-31 DIAGNOSIS — G250 Essential tremor: Secondary | ICD-10-CM | POA: Diagnosis not present

## 2024-05-31 DIAGNOSIS — Z96641 Presence of right artificial hip joint: Secondary | ICD-10-CM | POA: Diagnosis not present

## 2024-05-31 DIAGNOSIS — M80051D Age-related osteoporosis with current pathological fracture, right femur, subsequent encounter for fracture with routine healing: Secondary | ICD-10-CM | POA: Diagnosis not present

## 2024-05-31 DIAGNOSIS — E871 Hypo-osmolality and hyponatremia: Secondary | ICD-10-CM | POA: Diagnosis not present

## 2024-05-31 DIAGNOSIS — R338 Other retention of urine: Secondary | ICD-10-CM | POA: Diagnosis not present

## 2024-05-31 DIAGNOSIS — N401 Enlarged prostate with lower urinary tract symptoms: Secondary | ICD-10-CM | POA: Diagnosis not present

## 2024-06-01 DIAGNOSIS — M80051D Age-related osteoporosis with current pathological fracture, right femur, subsequent encounter for fracture with routine healing: Secondary | ICD-10-CM | POA: Diagnosis not present

## 2024-06-01 DIAGNOSIS — F411 Generalized anxiety disorder: Secondary | ICD-10-CM | POA: Diagnosis not present

## 2024-06-01 DIAGNOSIS — I10 Essential (primary) hypertension: Secondary | ICD-10-CM | POA: Diagnosis not present

## 2024-06-01 DIAGNOSIS — Z96641 Presence of right artificial hip joint: Secondary | ICD-10-CM | POA: Diagnosis not present

## 2024-06-01 DIAGNOSIS — N401 Enlarged prostate with lower urinary tract symptoms: Secondary | ICD-10-CM | POA: Diagnosis not present

## 2024-06-01 DIAGNOSIS — F1020 Alcohol dependence, uncomplicated: Secondary | ICD-10-CM | POA: Diagnosis not present

## 2024-06-01 DIAGNOSIS — G250 Essential tremor: Secondary | ICD-10-CM | POA: Diagnosis not present

## 2024-06-01 DIAGNOSIS — E871 Hypo-osmolality and hyponatremia: Secondary | ICD-10-CM | POA: Diagnosis not present

## 2024-06-01 DIAGNOSIS — R338 Other retention of urine: Secondary | ICD-10-CM | POA: Diagnosis not present

## 2024-06-05 DIAGNOSIS — M80051D Age-related osteoporosis with current pathological fracture, right femur, subsequent encounter for fracture with routine healing: Secondary | ICD-10-CM | POA: Diagnosis not present

## 2024-06-05 DIAGNOSIS — Z96641 Presence of right artificial hip joint: Secondary | ICD-10-CM | POA: Diagnosis not present

## 2024-06-05 DIAGNOSIS — N401 Enlarged prostate with lower urinary tract symptoms: Secondary | ICD-10-CM | POA: Diagnosis not present

## 2024-06-05 DIAGNOSIS — R338 Other retention of urine: Secondary | ICD-10-CM | POA: Diagnosis not present

## 2024-06-05 DIAGNOSIS — F1020 Alcohol dependence, uncomplicated: Secondary | ICD-10-CM | POA: Diagnosis not present

## 2024-06-05 DIAGNOSIS — I10 Essential (primary) hypertension: Secondary | ICD-10-CM | POA: Diagnosis not present

## 2024-06-05 DIAGNOSIS — G250 Essential tremor: Secondary | ICD-10-CM | POA: Diagnosis not present

## 2024-06-05 DIAGNOSIS — F411 Generalized anxiety disorder: Secondary | ICD-10-CM | POA: Diagnosis not present

## 2024-06-05 DIAGNOSIS — E871 Hypo-osmolality and hyponatremia: Secondary | ICD-10-CM | POA: Diagnosis not present

## 2024-06-13 DIAGNOSIS — Z6826 Body mass index (BMI) 26.0-26.9, adult: Secondary | ICD-10-CM | POA: Diagnosis not present

## 2024-06-13 DIAGNOSIS — G479 Sleep disorder, unspecified: Secondary | ICD-10-CM | POA: Diagnosis not present

## 2024-06-13 DIAGNOSIS — M545 Low back pain, unspecified: Secondary | ICD-10-CM | POA: Diagnosis not present

## 2024-06-13 DIAGNOSIS — F410 Panic disorder [episodic paroxysmal anxiety] without agoraphobia: Secondary | ICD-10-CM | POA: Diagnosis not present

## 2024-06-16 DIAGNOSIS — F411 Generalized anxiety disorder: Secondary | ICD-10-CM | POA: Diagnosis not present

## 2024-06-16 DIAGNOSIS — F1020 Alcohol dependence, uncomplicated: Secondary | ICD-10-CM | POA: Diagnosis not present

## 2024-06-16 DIAGNOSIS — R338 Other retention of urine: Secondary | ICD-10-CM | POA: Diagnosis not present

## 2024-06-16 DIAGNOSIS — M80051D Age-related osteoporosis with current pathological fracture, right femur, subsequent encounter for fracture with routine healing: Secondary | ICD-10-CM | POA: Diagnosis not present

## 2024-06-16 DIAGNOSIS — I10 Essential (primary) hypertension: Secondary | ICD-10-CM | POA: Diagnosis not present

## 2024-06-16 DIAGNOSIS — N401 Enlarged prostate with lower urinary tract symptoms: Secondary | ICD-10-CM | POA: Diagnosis not present

## 2024-06-16 DIAGNOSIS — E871 Hypo-osmolality and hyponatremia: Secondary | ICD-10-CM | POA: Diagnosis not present

## 2024-06-16 DIAGNOSIS — Z96641 Presence of right artificial hip joint: Secondary | ICD-10-CM | POA: Diagnosis not present

## 2024-06-16 DIAGNOSIS — G250 Essential tremor: Secondary | ICD-10-CM | POA: Diagnosis not present

## 2024-06-23 DIAGNOSIS — M25551 Pain in right hip: Secondary | ICD-10-CM | POA: Diagnosis not present

## 2024-08-19 DIAGNOSIS — R41841 Cognitive communication deficit: Secondary | ICD-10-CM | POA: Diagnosis not present

## 2024-08-19 DIAGNOSIS — K9189 Other postprocedural complications and disorders of digestive system: Secondary | ICD-10-CM | POA: Diagnosis not present

## 2024-08-19 DIAGNOSIS — N179 Acute kidney failure, unspecified: Secondary | ICD-10-CM | POA: Diagnosis not present

## 2024-08-19 DIAGNOSIS — I071 Rheumatic tricuspid insufficiency: Secondary | ICD-10-CM | POA: Diagnosis not present

## 2024-08-19 DIAGNOSIS — R296 Repeated falls: Secondary | ICD-10-CM | POA: Diagnosis not present

## 2024-08-19 DIAGNOSIS — F10930 Alcohol use, unspecified with withdrawal, uncomplicated: Secondary | ICD-10-CM | POA: Diagnosis not present

## 2024-08-19 DIAGNOSIS — F1010 Alcohol abuse, uncomplicated: Secondary | ICD-10-CM | POA: Diagnosis not present

## 2024-08-19 DIAGNOSIS — S32009S Unspecified fracture of unspecified lumbar vertebra, sequela: Secondary | ICD-10-CM | POA: Diagnosis not present

## 2024-08-19 DIAGNOSIS — S199XXA Unspecified injury of neck, initial encounter: Secondary | ICD-10-CM | POA: Diagnosis not present

## 2024-08-19 DIAGNOSIS — W19XXXD Unspecified fall, subsequent encounter: Secondary | ICD-10-CM | POA: Diagnosis not present

## 2024-08-19 DIAGNOSIS — S32049A Unspecified fracture of fourth lumbar vertebra, initial encounter for closed fracture: Secondary | ICD-10-CM | POA: Diagnosis not present

## 2024-08-19 DIAGNOSIS — K3189 Other diseases of stomach and duodenum: Secondary | ICD-10-CM | POA: Diagnosis not present

## 2024-08-19 DIAGNOSIS — S32009A Unspecified fracture of unspecified lumbar vertebra, initial encounter for closed fracture: Secondary | ICD-10-CM | POA: Diagnosis not present

## 2024-08-19 DIAGNOSIS — S22060A Wedge compression fracture of T7-T8 vertebra, initial encounter for closed fracture: Secondary | ICD-10-CM | POA: Diagnosis not present

## 2024-08-19 DIAGNOSIS — S299XXA Unspecified injury of thorax, initial encounter: Secondary | ICD-10-CM | POA: Diagnosis not present

## 2024-08-19 DIAGNOSIS — S32009D Unspecified fracture of unspecified lumbar vertebra, subsequent encounter for fracture with routine healing: Secondary | ICD-10-CM | POA: Diagnosis not present

## 2024-08-19 DIAGNOSIS — K760 Fatty (change of) liver, not elsewhere classified: Secondary | ICD-10-CM | POA: Diagnosis not present

## 2024-08-19 DIAGNOSIS — R262 Difficulty in walking, not elsewhere classified: Secondary | ICD-10-CM | POA: Diagnosis not present

## 2024-08-19 DIAGNOSIS — G9341 Metabolic encephalopathy: Secondary | ICD-10-CM | POA: Diagnosis not present

## 2024-08-19 DIAGNOSIS — K56600 Partial intestinal obstruction, unspecified as to cause: Secondary | ICD-10-CM | POA: Diagnosis not present

## 2024-08-19 DIAGNOSIS — F10239 Alcohol dependence with withdrawal, unspecified: Secondary | ICD-10-CM | POA: Diagnosis not present

## 2024-08-19 DIAGNOSIS — F10230 Alcohol dependence with withdrawal, uncomplicated: Secondary | ICD-10-CM | POA: Diagnosis not present

## 2024-08-19 DIAGNOSIS — W1830XA Fall on same level, unspecified, initial encounter: Secondary | ICD-10-CM | POA: Diagnosis not present

## 2024-08-19 DIAGNOSIS — E440 Moderate protein-calorie malnutrition: Secondary | ICD-10-CM | POA: Diagnosis not present

## 2024-08-19 DIAGNOSIS — Y92009 Unspecified place in unspecified non-institutional (private) residence as the place of occurrence of the external cause: Secondary | ICD-10-CM | POA: Diagnosis not present

## 2024-08-19 DIAGNOSIS — M6281 Muscle weakness (generalized): Secondary | ICD-10-CM | POA: Diagnosis not present

## 2024-08-19 DIAGNOSIS — F1020 Alcohol dependence, uncomplicated: Secondary | ICD-10-CM | POA: Diagnosis not present

## 2024-08-19 DIAGNOSIS — S32029A Unspecified fracture of second lumbar vertebra, initial encounter for closed fracture: Secondary | ICD-10-CM | POA: Diagnosis not present

## 2024-08-19 DIAGNOSIS — Z4682 Encounter for fitting and adjustment of non-vascular catheter: Secondary | ICD-10-CM | POA: Diagnosis not present

## 2024-08-19 DIAGNOSIS — S32039A Unspecified fracture of third lumbar vertebra, initial encounter for closed fracture: Secondary | ICD-10-CM | POA: Diagnosis not present

## 2024-08-19 DIAGNOSIS — S0990XA Unspecified injury of head, initial encounter: Secondary | ICD-10-CM | POA: Diagnosis not present

## 2024-08-19 DIAGNOSIS — R030 Elevated blood-pressure reading, without diagnosis of hypertension: Secondary | ICD-10-CM | POA: Diagnosis not present

## 2024-08-19 DIAGNOSIS — R0602 Shortness of breath: Secondary | ICD-10-CM | POA: Diagnosis not present

## 2024-08-19 DIAGNOSIS — T796XXA Traumatic ischemia of muscle, initial encounter: Secondary | ICD-10-CM | POA: Diagnosis not present

## 2024-08-19 DIAGNOSIS — W19XXXA Unspecified fall, initial encounter: Secondary | ICD-10-CM | POA: Diagnosis not present

## 2024-08-19 DIAGNOSIS — E871 Hypo-osmolality and hyponatremia: Secondary | ICD-10-CM | POA: Diagnosis not present

## 2024-08-19 DIAGNOSIS — J690 Pneumonitis due to inhalation of food and vomit: Secondary | ICD-10-CM | POA: Diagnosis not present

## 2024-08-19 DIAGNOSIS — Y909 Presence of alcohol in blood, level not specified: Secondary | ICD-10-CM | POA: Diagnosis not present

## 2024-08-19 DIAGNOSIS — K6389 Other specified diseases of intestine: Secondary | ICD-10-CM | POA: Diagnosis not present

## 2024-08-19 DIAGNOSIS — D123 Benign neoplasm of transverse colon: Secondary | ICD-10-CM | POA: Diagnosis not present

## 2024-08-19 DIAGNOSIS — K567 Ileus, unspecified: Secondary | ICD-10-CM | POA: Diagnosis not present

## 2024-08-19 DIAGNOSIS — R918 Other nonspecific abnormal finding of lung field: Secondary | ICD-10-CM | POA: Diagnosis not present

## 2024-08-19 DIAGNOSIS — T796XXS Traumatic ischemia of muscle, sequela: Secondary | ICD-10-CM | POA: Diagnosis not present

## 2024-08-19 DIAGNOSIS — R748 Abnormal levels of other serum enzymes: Secondary | ICD-10-CM | POA: Diagnosis not present

## 2024-08-19 DIAGNOSIS — F419 Anxiety disorder, unspecified: Secondary | ICD-10-CM | POA: Diagnosis not present

## 2024-08-20 DIAGNOSIS — S32039A Unspecified fracture of third lumbar vertebra, initial encounter for closed fracture: Secondary | ICD-10-CM | POA: Diagnosis not present

## 2024-08-20 DIAGNOSIS — E871 Hypo-osmolality and hyponatremia: Secondary | ICD-10-CM | POA: Diagnosis not present

## 2024-08-20 DIAGNOSIS — N179 Acute kidney failure, unspecified: Secondary | ICD-10-CM | POA: Diagnosis not present

## 2024-08-20 DIAGNOSIS — F1020 Alcohol dependence, uncomplicated: Secondary | ICD-10-CM | POA: Diagnosis not present

## 2024-08-20 DIAGNOSIS — K56600 Partial intestinal obstruction, unspecified as to cause: Secondary | ICD-10-CM | POA: Diagnosis not present

## 2024-08-20 DIAGNOSIS — J690 Pneumonitis due to inhalation of food and vomit: Secondary | ICD-10-CM | POA: Diagnosis not present

## 2024-08-20 DIAGNOSIS — F10239 Alcohol dependence with withdrawal, unspecified: Secondary | ICD-10-CM | POA: Diagnosis not present

## 2024-08-20 DIAGNOSIS — S32049A Unspecified fracture of fourth lumbar vertebra, initial encounter for closed fracture: Secondary | ICD-10-CM | POA: Diagnosis not present

## 2024-08-20 DIAGNOSIS — S32029A Unspecified fracture of second lumbar vertebra, initial encounter for closed fracture: Secondary | ICD-10-CM | POA: Diagnosis not present

## 2024-08-20 DIAGNOSIS — G9341 Metabolic encephalopathy: Secondary | ICD-10-CM | POA: Diagnosis not present

## 2024-08-21 DIAGNOSIS — F10930 Alcohol use, unspecified with withdrawal, uncomplicated: Secondary | ICD-10-CM | POA: Diagnosis not present

## 2024-08-21 DIAGNOSIS — E871 Hypo-osmolality and hyponatremia: Secondary | ICD-10-CM | POA: Diagnosis not present

## 2024-08-21 DIAGNOSIS — I071 Rheumatic tricuspid insufficiency: Secondary | ICD-10-CM | POA: Diagnosis not present

## 2024-08-22 DIAGNOSIS — F10930 Alcohol use, unspecified with withdrawal, uncomplicated: Secondary | ICD-10-CM | POA: Diagnosis not present

## 2024-08-22 DIAGNOSIS — E871 Hypo-osmolality and hyponatremia: Secondary | ICD-10-CM | POA: Diagnosis not present

## 2024-08-23 DIAGNOSIS — E871 Hypo-osmolality and hyponatremia: Secondary | ICD-10-CM | POA: Diagnosis not present

## 2024-08-24 DIAGNOSIS — E871 Hypo-osmolality and hyponatremia: Secondary | ICD-10-CM | POA: Diagnosis not present

## 2024-08-25 DIAGNOSIS — N179 Acute kidney failure, unspecified: Secondary | ICD-10-CM | POA: Diagnosis not present

## 2024-08-25 DIAGNOSIS — G9341 Metabolic encephalopathy: Secondary | ICD-10-CM | POA: Diagnosis not present

## 2024-08-25 DIAGNOSIS — S32039A Unspecified fracture of third lumbar vertebra, initial encounter for closed fracture: Secondary | ICD-10-CM | POA: Diagnosis not present

## 2024-08-25 DIAGNOSIS — S32029A Unspecified fracture of second lumbar vertebra, initial encounter for closed fracture: Secondary | ICD-10-CM | POA: Diagnosis not present

## 2024-08-25 DIAGNOSIS — F10239 Alcohol dependence with withdrawal, unspecified: Secondary | ICD-10-CM | POA: Diagnosis not present

## 2024-08-25 DIAGNOSIS — E871 Hypo-osmolality and hyponatremia: Secondary | ICD-10-CM | POA: Diagnosis not present

## 2024-08-25 DIAGNOSIS — K56600 Partial intestinal obstruction, unspecified as to cause: Secondary | ICD-10-CM | POA: Diagnosis not present

## 2024-08-25 DIAGNOSIS — J690 Pneumonitis due to inhalation of food and vomit: Secondary | ICD-10-CM | POA: Diagnosis not present

## 2024-08-25 DIAGNOSIS — S32049A Unspecified fracture of fourth lumbar vertebra, initial encounter for closed fracture: Secondary | ICD-10-CM | POA: Diagnosis not present

## 2024-08-26 DIAGNOSIS — E871 Hypo-osmolality and hyponatremia: Secondary | ICD-10-CM | POA: Diagnosis not present

## 2024-08-26 DIAGNOSIS — W19XXXD Unspecified fall, subsequent encounter: Secondary | ICD-10-CM | POA: Diagnosis not present

## 2024-08-26 DIAGNOSIS — F1020 Alcohol dependence, uncomplicated: Secondary | ICD-10-CM | POA: Diagnosis not present

## 2024-08-26 DIAGNOSIS — S32009S Unspecified fracture of unspecified lumbar vertebra, sequela: Secondary | ICD-10-CM | POA: Diagnosis not present

## 2024-08-26 DIAGNOSIS — R030 Elevated blood-pressure reading, without diagnosis of hypertension: Secondary | ICD-10-CM | POA: Diagnosis not present

## 2024-08-26 DIAGNOSIS — T796XXS Traumatic ischemia of muscle, sequela: Secondary | ICD-10-CM | POA: Diagnosis not present

## 2024-08-27 DIAGNOSIS — E871 Hypo-osmolality and hyponatremia: Secondary | ICD-10-CM | POA: Diagnosis not present

## 2024-08-28 DIAGNOSIS — E871 Hypo-osmolality and hyponatremia: Secondary | ICD-10-CM | POA: Diagnosis not present

## 2024-08-29 DIAGNOSIS — S32009S Unspecified fracture of unspecified lumbar vertebra, sequela: Secondary | ICD-10-CM | POA: Diagnosis not present

## 2024-08-29 DIAGNOSIS — W19XXXD Unspecified fall, subsequent encounter: Secondary | ICD-10-CM | POA: Diagnosis not present

## 2024-08-29 DIAGNOSIS — R030 Elevated blood-pressure reading, without diagnosis of hypertension: Secondary | ICD-10-CM | POA: Diagnosis not present

## 2024-08-29 DIAGNOSIS — T796XXS Traumatic ischemia of muscle, sequela: Secondary | ICD-10-CM | POA: Diagnosis not present

## 2024-08-29 DIAGNOSIS — F10230 Alcohol dependence with withdrawal, uncomplicated: Secondary | ICD-10-CM | POA: Diagnosis not present

## 2024-08-29 DIAGNOSIS — E871 Hypo-osmolality and hyponatremia: Secondary | ICD-10-CM | POA: Diagnosis not present

## 2024-09-02 DIAGNOSIS — S32009S Unspecified fracture of unspecified lumbar vertebra, sequela: Secondary | ICD-10-CM | POA: Diagnosis not present

## 2024-09-02 DIAGNOSIS — J9811 Atelectasis: Secondary | ICD-10-CM | POA: Diagnosis not present

## 2024-09-02 DIAGNOSIS — T796XXS Traumatic ischemia of muscle, sequela: Secondary | ICD-10-CM | POA: Diagnosis not present

## 2024-09-02 DIAGNOSIS — I10 Essential (primary) hypertension: Secondary | ICD-10-CM | POA: Diagnosis not present

## 2024-09-02 DIAGNOSIS — Z6830 Body mass index (BMI) 30.0-30.9, adult: Secondary | ICD-10-CM | POA: Diagnosis not present

## 2024-09-02 DIAGNOSIS — R053 Chronic cough: Secondary | ICD-10-CM | POA: Diagnosis not present

## 2024-09-02 DIAGNOSIS — Z09 Encounter for follow-up examination after completed treatment for conditions other than malignant neoplasm: Secondary | ICD-10-CM | POA: Diagnosis not present

## 2024-09-02 DIAGNOSIS — W19XXXD Unspecified fall, subsequent encounter: Secondary | ICD-10-CM | POA: Diagnosis not present

## 2024-09-02 DIAGNOSIS — S32049D Unspecified fracture of fourth lumbar vertebra, subsequent encounter for fracture with routine healing: Secondary | ICD-10-CM | POA: Diagnosis not present

## 2024-09-02 DIAGNOSIS — S32029D Unspecified fracture of second lumbar vertebra, subsequent encounter for fracture with routine healing: Secondary | ICD-10-CM | POA: Diagnosis not present

## 2024-09-02 DIAGNOSIS — M6281 Muscle weakness (generalized): Secondary | ICD-10-CM | POA: Diagnosis not present

## 2024-09-02 DIAGNOSIS — S32009D Unspecified fracture of unspecified lumbar vertebra, subsequent encounter for fracture with routine healing: Secondary | ICD-10-CM | POA: Diagnosis not present

## 2024-09-02 DIAGNOSIS — S32039D Unspecified fracture of third lumbar vertebra, subsequent encounter for fracture with routine healing: Secondary | ICD-10-CM | POA: Diagnosis not present

## 2024-09-02 DIAGNOSIS — R059 Cough, unspecified: Secondary | ICD-10-CM | POA: Diagnosis not present

## 2024-09-02 DIAGNOSIS — R41841 Cognitive communication deficit: Secondary | ICD-10-CM | POA: Diagnosis not present

## 2024-09-02 DIAGNOSIS — R030 Elevated blood-pressure reading, without diagnosis of hypertension: Secondary | ICD-10-CM | POA: Diagnosis not present

## 2024-09-02 DIAGNOSIS — K9189 Other postprocedural complications and disorders of digestive system: Secondary | ICD-10-CM | POA: Diagnosis not present

## 2024-09-02 DIAGNOSIS — F1020 Alcohol dependence, uncomplicated: Secondary | ICD-10-CM | POA: Diagnosis not present

## 2024-09-02 DIAGNOSIS — F1010 Alcohol abuse, uncomplicated: Secondary | ICD-10-CM | POA: Diagnosis not present

## 2024-09-02 DIAGNOSIS — K567 Ileus, unspecified: Secondary | ICD-10-CM | POA: Diagnosis not present

## 2024-09-02 DIAGNOSIS — F419 Anxiety disorder, unspecified: Secondary | ICD-10-CM | POA: Diagnosis not present

## 2024-09-02 DIAGNOSIS — Z8701 Personal history of pneumonia (recurrent): Secondary | ICD-10-CM | POA: Diagnosis not present

## 2024-09-02 DIAGNOSIS — R262 Difficulty in walking, not elsewhere classified: Secondary | ICD-10-CM | POA: Diagnosis not present

## 2024-09-02 DIAGNOSIS — E871 Hypo-osmolality and hyponatremia: Secondary | ICD-10-CM | POA: Diagnosis not present

## 2024-09-02 DIAGNOSIS — F10930 Alcohol use, unspecified with withdrawal, uncomplicated: Secondary | ICD-10-CM | POA: Diagnosis not present

## 2024-09-02 DIAGNOSIS — D123 Benign neoplasm of transverse colon: Secondary | ICD-10-CM | POA: Diagnosis not present

## 2024-09-02 DIAGNOSIS — D539 Nutritional anemia, unspecified: Secondary | ICD-10-CM | POA: Diagnosis not present

## 2024-09-02 DIAGNOSIS — E440 Moderate protein-calorie malnutrition: Secondary | ICD-10-CM | POA: Diagnosis not present

## 2024-09-04 DIAGNOSIS — D123 Benign neoplasm of transverse colon: Secondary | ICD-10-CM | POA: Diagnosis not present

## 2024-09-04 DIAGNOSIS — E440 Moderate protein-calorie malnutrition: Secondary | ICD-10-CM | POA: Diagnosis not present

## 2024-09-04 DIAGNOSIS — F1020 Alcohol dependence, uncomplicated: Secondary | ICD-10-CM | POA: Diagnosis not present

## 2024-09-04 DIAGNOSIS — K9189 Other postprocedural complications and disorders of digestive system: Secondary | ICD-10-CM | POA: Diagnosis not present

## 2024-09-04 DIAGNOSIS — K567 Ileus, unspecified: Secondary | ICD-10-CM | POA: Diagnosis not present

## 2024-09-04 DIAGNOSIS — F419 Anxiety disorder, unspecified: Secondary | ICD-10-CM | POA: Diagnosis not present

## 2024-09-05 DIAGNOSIS — D123 Benign neoplasm of transverse colon: Secondary | ICD-10-CM | POA: Diagnosis not present

## 2024-09-05 DIAGNOSIS — K567 Ileus, unspecified: Secondary | ICD-10-CM | POA: Diagnosis not present

## 2024-09-05 DIAGNOSIS — S32009D Unspecified fracture of unspecified lumbar vertebra, subsequent encounter for fracture with routine healing: Secondary | ICD-10-CM | POA: Diagnosis not present

## 2024-09-05 DIAGNOSIS — E440 Moderate protein-calorie malnutrition: Secondary | ICD-10-CM | POA: Diagnosis not present

## 2024-09-05 DIAGNOSIS — K9189 Other postprocedural complications and disorders of digestive system: Secondary | ICD-10-CM | POA: Diagnosis not present

## 2024-09-05 DIAGNOSIS — F419 Anxiety disorder, unspecified: Secondary | ICD-10-CM | POA: Diagnosis not present

## 2024-09-05 DIAGNOSIS — F1020 Alcohol dependence, uncomplicated: Secondary | ICD-10-CM | POA: Diagnosis not present

## 2024-09-06 DIAGNOSIS — D123 Benign neoplasm of transverse colon: Secondary | ICD-10-CM | POA: Diagnosis not present

## 2024-09-06 DIAGNOSIS — F1020 Alcohol dependence, uncomplicated: Secondary | ICD-10-CM | POA: Diagnosis not present

## 2024-09-06 DIAGNOSIS — K9189 Other postprocedural complications and disorders of digestive system: Secondary | ICD-10-CM | POA: Diagnosis not present

## 2024-09-06 DIAGNOSIS — K567 Ileus, unspecified: Secondary | ICD-10-CM | POA: Diagnosis not present

## 2024-09-06 DIAGNOSIS — E440 Moderate protein-calorie malnutrition: Secondary | ICD-10-CM | POA: Diagnosis not present

## 2024-09-06 DIAGNOSIS — F419 Anxiety disorder, unspecified: Secondary | ICD-10-CM | POA: Diagnosis not present

## 2024-09-07 DIAGNOSIS — F1020 Alcohol dependence, uncomplicated: Secondary | ICD-10-CM | POA: Diagnosis not present

## 2024-09-07 DIAGNOSIS — D123 Benign neoplasm of transverse colon: Secondary | ICD-10-CM | POA: Diagnosis not present

## 2024-09-07 DIAGNOSIS — E440 Moderate protein-calorie malnutrition: Secondary | ICD-10-CM | POA: Diagnosis not present

## 2024-09-07 DIAGNOSIS — K9189 Other postprocedural complications and disorders of digestive system: Secondary | ICD-10-CM | POA: Diagnosis not present

## 2024-09-07 DIAGNOSIS — K567 Ileus, unspecified: Secondary | ICD-10-CM | POA: Diagnosis not present

## 2024-09-07 DIAGNOSIS — F419 Anxiety disorder, unspecified: Secondary | ICD-10-CM | POA: Diagnosis not present

## 2024-09-08 DIAGNOSIS — K567 Ileus, unspecified: Secondary | ICD-10-CM | POA: Diagnosis not present

## 2024-09-08 DIAGNOSIS — E440 Moderate protein-calorie malnutrition: Secondary | ICD-10-CM | POA: Diagnosis not present

## 2024-09-08 DIAGNOSIS — F419 Anxiety disorder, unspecified: Secondary | ICD-10-CM | POA: Diagnosis not present

## 2024-09-08 DIAGNOSIS — F1020 Alcohol dependence, uncomplicated: Secondary | ICD-10-CM | POA: Diagnosis not present

## 2024-09-08 DIAGNOSIS — D123 Benign neoplasm of transverse colon: Secondary | ICD-10-CM | POA: Diagnosis not present

## 2024-09-08 DIAGNOSIS — K9189 Other postprocedural complications and disorders of digestive system: Secondary | ICD-10-CM | POA: Diagnosis not present

## 2024-09-11 DIAGNOSIS — D123 Benign neoplasm of transverse colon: Secondary | ICD-10-CM | POA: Diagnosis not present

## 2024-09-11 DIAGNOSIS — E440 Moderate protein-calorie malnutrition: Secondary | ICD-10-CM | POA: Diagnosis not present

## 2024-09-11 DIAGNOSIS — K567 Ileus, unspecified: Secondary | ICD-10-CM | POA: Diagnosis not present

## 2024-09-11 DIAGNOSIS — K9189 Other postprocedural complications and disorders of digestive system: Secondary | ICD-10-CM | POA: Diagnosis not present

## 2024-09-11 DIAGNOSIS — F1020 Alcohol dependence, uncomplicated: Secondary | ICD-10-CM | POA: Diagnosis not present

## 2024-09-11 DIAGNOSIS — F419 Anxiety disorder, unspecified: Secondary | ICD-10-CM | POA: Diagnosis not present

## 2024-09-12 DIAGNOSIS — E440 Moderate protein-calorie malnutrition: Secondary | ICD-10-CM | POA: Diagnosis not present

## 2024-09-12 DIAGNOSIS — S32039D Unspecified fracture of third lumbar vertebra, subsequent encounter for fracture with routine healing: Secondary | ICD-10-CM | POA: Diagnosis not present

## 2024-09-12 DIAGNOSIS — S32029D Unspecified fracture of second lumbar vertebra, subsequent encounter for fracture with routine healing: Secondary | ICD-10-CM | POA: Diagnosis not present

## 2024-09-12 DIAGNOSIS — I10 Essential (primary) hypertension: Secondary | ICD-10-CM | POA: Diagnosis not present

## 2024-09-12 DIAGNOSIS — J9811 Atelectasis: Secondary | ICD-10-CM | POA: Diagnosis not present

## 2024-09-12 DIAGNOSIS — Z8701 Personal history of pneumonia (recurrent): Secondary | ICD-10-CM | POA: Diagnosis not present

## 2024-09-12 DIAGNOSIS — Z6830 Body mass index (BMI) 30.0-30.9, adult: Secondary | ICD-10-CM | POA: Diagnosis not present

## 2024-09-12 DIAGNOSIS — R053 Chronic cough: Secondary | ICD-10-CM | POA: Diagnosis not present

## 2024-09-12 DIAGNOSIS — S32049D Unspecified fracture of fourth lumbar vertebra, subsequent encounter for fracture with routine healing: Secondary | ICD-10-CM | POA: Diagnosis not present

## 2024-09-13 DIAGNOSIS — J9811 Atelectasis: Secondary | ICD-10-CM | POA: Diagnosis not present

## 2024-09-13 DIAGNOSIS — Z6830 Body mass index (BMI) 30.0-30.9, adult: Secondary | ICD-10-CM | POA: Diagnosis not present

## 2024-09-13 DIAGNOSIS — I10 Essential (primary) hypertension: Secondary | ICD-10-CM | POA: Diagnosis not present

## 2024-09-13 DIAGNOSIS — E440 Moderate protein-calorie malnutrition: Secondary | ICD-10-CM | POA: Diagnosis not present

## 2024-09-13 DIAGNOSIS — S32049D Unspecified fracture of fourth lumbar vertebra, subsequent encounter for fracture with routine healing: Secondary | ICD-10-CM | POA: Diagnosis not present

## 2024-09-13 DIAGNOSIS — S32039D Unspecified fracture of third lumbar vertebra, subsequent encounter for fracture with routine healing: Secondary | ICD-10-CM | POA: Diagnosis not present

## 2024-09-13 DIAGNOSIS — Z8701 Personal history of pneumonia (recurrent): Secondary | ICD-10-CM | POA: Diagnosis not present

## 2024-09-13 DIAGNOSIS — R053 Chronic cough: Secondary | ICD-10-CM | POA: Diagnosis not present

## 2024-09-13 DIAGNOSIS — S32029D Unspecified fracture of second lumbar vertebra, subsequent encounter for fracture with routine healing: Secondary | ICD-10-CM | POA: Diagnosis not present

## 2024-09-14 DIAGNOSIS — S32029D Unspecified fracture of second lumbar vertebra, subsequent encounter for fracture with routine healing: Secondary | ICD-10-CM | POA: Diagnosis not present

## 2024-09-14 DIAGNOSIS — I10 Essential (primary) hypertension: Secondary | ICD-10-CM | POA: Diagnosis not present

## 2024-09-14 DIAGNOSIS — R053 Chronic cough: Secondary | ICD-10-CM | POA: Diagnosis not present

## 2024-09-14 DIAGNOSIS — S32039D Unspecified fracture of third lumbar vertebra, subsequent encounter for fracture with routine healing: Secondary | ICD-10-CM | POA: Diagnosis not present

## 2024-09-14 DIAGNOSIS — Z09 Encounter for follow-up examination after completed treatment for conditions other than malignant neoplasm: Secondary | ICD-10-CM | POA: Diagnosis not present

## 2024-09-14 DIAGNOSIS — Z8701 Personal history of pneumonia (recurrent): Secondary | ICD-10-CM | POA: Diagnosis not present

## 2024-09-14 DIAGNOSIS — J9811 Atelectasis: Secondary | ICD-10-CM | POA: Diagnosis not present

## 2024-09-14 DIAGNOSIS — E440 Moderate protein-calorie malnutrition: Secondary | ICD-10-CM | POA: Diagnosis not present

## 2024-09-14 DIAGNOSIS — Z6830 Body mass index (BMI) 30.0-30.9, adult: Secondary | ICD-10-CM | POA: Diagnosis not present

## 2024-09-15 DIAGNOSIS — E440 Moderate protein-calorie malnutrition: Secondary | ICD-10-CM | POA: Diagnosis not present

## 2024-09-15 DIAGNOSIS — R053 Chronic cough: Secondary | ICD-10-CM | POA: Diagnosis not present

## 2024-09-15 DIAGNOSIS — J9811 Atelectasis: Secondary | ICD-10-CM | POA: Diagnosis not present

## 2024-09-15 DIAGNOSIS — D539 Nutritional anemia, unspecified: Secondary | ICD-10-CM | POA: Diagnosis not present

## 2024-09-15 DIAGNOSIS — F1020 Alcohol dependence, uncomplicated: Secondary | ICD-10-CM | POA: Diagnosis not present

## 2024-09-15 DIAGNOSIS — S32029D Unspecified fracture of second lumbar vertebra, subsequent encounter for fracture with routine healing: Secondary | ICD-10-CM | POA: Diagnosis not present

## 2024-09-15 DIAGNOSIS — S32049D Unspecified fracture of fourth lumbar vertebra, subsequent encounter for fracture with routine healing: Secondary | ICD-10-CM | POA: Diagnosis not present

## 2024-09-15 DIAGNOSIS — S32039D Unspecified fracture of third lumbar vertebra, subsequent encounter for fracture with routine healing: Secondary | ICD-10-CM | POA: Diagnosis not present

## 2024-09-15 DIAGNOSIS — I10 Essential (primary) hypertension: Secondary | ICD-10-CM | POA: Diagnosis not present

## 2024-09-18 DIAGNOSIS — D539 Nutritional anemia, unspecified: Secondary | ICD-10-CM | POA: Diagnosis not present

## 2024-09-18 DIAGNOSIS — R053 Chronic cough: Secondary | ICD-10-CM | POA: Diagnosis not present

## 2024-09-18 DIAGNOSIS — S32039D Unspecified fracture of third lumbar vertebra, subsequent encounter for fracture with routine healing: Secondary | ICD-10-CM | POA: Diagnosis not present

## 2024-09-18 DIAGNOSIS — E440 Moderate protein-calorie malnutrition: Secondary | ICD-10-CM | POA: Diagnosis not present

## 2024-09-18 DIAGNOSIS — S32029D Unspecified fracture of second lumbar vertebra, subsequent encounter for fracture with routine healing: Secondary | ICD-10-CM | POA: Diagnosis not present

## 2024-09-18 DIAGNOSIS — S32049D Unspecified fracture of fourth lumbar vertebra, subsequent encounter for fracture with routine healing: Secondary | ICD-10-CM | POA: Diagnosis not present

## 2024-09-18 DIAGNOSIS — F1020 Alcohol dependence, uncomplicated: Secondary | ICD-10-CM | POA: Diagnosis not present

## 2024-09-18 DIAGNOSIS — J9811 Atelectasis: Secondary | ICD-10-CM | POA: Diagnosis not present

## 2024-09-18 DIAGNOSIS — I10 Essential (primary) hypertension: Secondary | ICD-10-CM | POA: Diagnosis not present

## 2024-09-19 DIAGNOSIS — S32029D Unspecified fracture of second lumbar vertebra, subsequent encounter for fracture with routine healing: Secondary | ICD-10-CM | POA: Diagnosis not present

## 2024-09-19 DIAGNOSIS — S32039D Unspecified fracture of third lumbar vertebra, subsequent encounter for fracture with routine healing: Secondary | ICD-10-CM | POA: Diagnosis not present

## 2024-09-19 DIAGNOSIS — R41841 Cognitive communication deficit: Secondary | ICD-10-CM | POA: Diagnosis not present

## 2024-09-19 DIAGNOSIS — D539 Nutritional anemia, unspecified: Secondary | ICD-10-CM | POA: Diagnosis not present

## 2024-09-19 DIAGNOSIS — I10 Essential (primary) hypertension: Secondary | ICD-10-CM | POA: Diagnosis not present

## 2024-09-19 DIAGNOSIS — E440 Moderate protein-calorie malnutrition: Secondary | ICD-10-CM | POA: Diagnosis not present

## 2024-09-19 DIAGNOSIS — R053 Chronic cough: Secondary | ICD-10-CM | POA: Diagnosis not present

## 2024-09-19 DIAGNOSIS — R262 Difficulty in walking, not elsewhere classified: Secondary | ICD-10-CM | POA: Diagnosis not present

## 2024-09-19 DIAGNOSIS — J9811 Atelectasis: Secondary | ICD-10-CM | POA: Diagnosis not present

## 2024-09-20 DIAGNOSIS — R41841 Cognitive communication deficit: Secondary | ICD-10-CM | POA: Diagnosis not present

## 2024-09-20 DIAGNOSIS — D539 Nutritional anemia, unspecified: Secondary | ICD-10-CM | POA: Diagnosis not present

## 2024-09-20 DIAGNOSIS — J9811 Atelectasis: Secondary | ICD-10-CM | POA: Diagnosis not present

## 2024-09-20 DIAGNOSIS — S32039D Unspecified fracture of third lumbar vertebra, subsequent encounter for fracture with routine healing: Secondary | ICD-10-CM | POA: Diagnosis not present

## 2024-09-20 DIAGNOSIS — I10 Essential (primary) hypertension: Secondary | ICD-10-CM | POA: Diagnosis not present

## 2024-09-20 DIAGNOSIS — R262 Difficulty in walking, not elsewhere classified: Secondary | ICD-10-CM | POA: Diagnosis not present

## 2024-09-20 DIAGNOSIS — R053 Chronic cough: Secondary | ICD-10-CM | POA: Diagnosis not present

## 2024-09-20 DIAGNOSIS — E440 Moderate protein-calorie malnutrition: Secondary | ICD-10-CM | POA: Diagnosis not present

## 2024-09-20 DIAGNOSIS — S32029D Unspecified fracture of second lumbar vertebra, subsequent encounter for fracture with routine healing: Secondary | ICD-10-CM | POA: Diagnosis not present

## 2024-09-29 DIAGNOSIS — S22068D Other fracture of T7-T8 thoracic vertebra, subsequent encounter for fracture with routine healing: Secondary | ICD-10-CM | POA: Diagnosis not present

## 2024-09-29 DIAGNOSIS — S32039D Unspecified fracture of third lumbar vertebra, subsequent encounter for fracture with routine healing: Secondary | ICD-10-CM | POA: Diagnosis not present

## 2024-09-29 DIAGNOSIS — R6510 Systemic inflammatory response syndrome (SIRS) of non-infectious origin without acute organ dysfunction: Secondary | ICD-10-CM | POA: Diagnosis not present

## 2024-09-29 DIAGNOSIS — S32029D Unspecified fracture of second lumbar vertebra, subsequent encounter for fracture with routine healing: Secondary | ICD-10-CM | POA: Diagnosis not present

## 2024-09-29 DIAGNOSIS — E440 Moderate protein-calorie malnutrition: Secondary | ICD-10-CM | POA: Diagnosis not present

## 2024-09-29 DIAGNOSIS — I10 Essential (primary) hypertension: Secondary | ICD-10-CM | POA: Diagnosis not present

## 2024-09-29 DIAGNOSIS — D539 Nutritional anemia, unspecified: Secondary | ICD-10-CM | POA: Diagnosis not present

## 2024-09-29 DIAGNOSIS — S32049D Unspecified fracture of fourth lumbar vertebra, subsequent encounter for fracture with routine healing: Secondary | ICD-10-CM | POA: Diagnosis not present

## 2024-09-29 DIAGNOSIS — M810 Age-related osteoporosis without current pathological fracture: Secondary | ICD-10-CM | POA: Diagnosis not present

## 2024-10-05 DIAGNOSIS — S32038G Other fracture of third lumbar vertebra, subsequent encounter for fracture with delayed healing: Secondary | ICD-10-CM | POA: Diagnosis not present

## 2024-10-05 DIAGNOSIS — S32048G Other fracture of fourth lumbar vertebra, subsequent encounter for fracture with delayed healing: Secondary | ICD-10-CM | POA: Diagnosis not present

## 2024-10-05 DIAGNOSIS — M816 Localized osteoporosis [Lequesne]: Secondary | ICD-10-CM | POA: Diagnosis not present

## 2024-10-05 DIAGNOSIS — S32028G Other fracture of second lumbar vertebra, subsequent encounter for fracture with delayed healing: Secondary | ICD-10-CM | POA: Diagnosis not present

## 2024-10-11 DIAGNOSIS — S32009G Unspecified fracture of unspecified lumbar vertebra, subsequent encounter for fracture with delayed healing: Secondary | ICD-10-CM | POA: Diagnosis not present

## 2024-10-11 DIAGNOSIS — M816 Localized osteoporosis [Lequesne]: Secondary | ICD-10-CM | POA: Diagnosis not present

## 2024-10-13 DIAGNOSIS — M810 Age-related osteoporosis without current pathological fracture: Secondary | ICD-10-CM | POA: Diagnosis not present

## 2024-10-16 DIAGNOSIS — I872 Venous insufficiency (chronic) (peripheral): Secondary | ICD-10-CM | POA: Diagnosis not present

## 2024-10-17 DIAGNOSIS — S32039D Unspecified fracture of third lumbar vertebra, subsequent encounter for fracture with routine healing: Secondary | ICD-10-CM | POA: Diagnosis not present

## 2024-10-17 DIAGNOSIS — S32049D Unspecified fracture of fourth lumbar vertebra, subsequent encounter for fracture with routine healing: Secondary | ICD-10-CM | POA: Diagnosis not present

## 2024-10-17 DIAGNOSIS — D539 Nutritional anemia, unspecified: Secondary | ICD-10-CM | POA: Diagnosis not present

## 2024-10-17 DIAGNOSIS — M810 Age-related osteoporosis without current pathological fracture: Secondary | ICD-10-CM | POA: Diagnosis not present

## 2024-10-17 DIAGNOSIS — I10 Essential (primary) hypertension: Secondary | ICD-10-CM | POA: Diagnosis not present

## 2024-10-17 DIAGNOSIS — E440 Moderate protein-calorie malnutrition: Secondary | ICD-10-CM | POA: Diagnosis not present

## 2024-10-17 DIAGNOSIS — S22068D Other fracture of T7-T8 thoracic vertebra, subsequent encounter for fracture with routine healing: Secondary | ICD-10-CM | POA: Diagnosis not present

## 2024-10-17 DIAGNOSIS — R6510 Systemic inflammatory response syndrome (SIRS) of non-infectious origin without acute organ dysfunction: Secondary | ICD-10-CM | POA: Diagnosis not present

## 2024-10-17 DIAGNOSIS — S32029D Unspecified fracture of second lumbar vertebra, subsequent encounter for fracture with routine healing: Secondary | ICD-10-CM | POA: Diagnosis not present

## 2024-10-19 DIAGNOSIS — S22068D Other fracture of T7-T8 thoracic vertebra, subsequent encounter for fracture with routine healing: Secondary | ICD-10-CM | POA: Diagnosis not present

## 2024-10-19 DIAGNOSIS — I10 Essential (primary) hypertension: Secondary | ICD-10-CM | POA: Diagnosis not present

## 2024-10-19 DIAGNOSIS — S32049D Unspecified fracture of fourth lumbar vertebra, subsequent encounter for fracture with routine healing: Secondary | ICD-10-CM | POA: Diagnosis not present

## 2024-10-19 DIAGNOSIS — S32039D Unspecified fracture of third lumbar vertebra, subsequent encounter for fracture with routine healing: Secondary | ICD-10-CM | POA: Diagnosis not present

## 2024-10-19 DIAGNOSIS — R6510 Systemic inflammatory response syndrome (SIRS) of non-infectious origin without acute organ dysfunction: Secondary | ICD-10-CM | POA: Diagnosis not present

## 2024-10-19 DIAGNOSIS — S32029D Unspecified fracture of second lumbar vertebra, subsequent encounter for fracture with routine healing: Secondary | ICD-10-CM | POA: Diagnosis not present

## 2024-10-19 DIAGNOSIS — E440 Moderate protein-calorie malnutrition: Secondary | ICD-10-CM | POA: Diagnosis not present

## 2024-10-19 DIAGNOSIS — M810 Age-related osteoporosis without current pathological fracture: Secondary | ICD-10-CM | POA: Diagnosis not present

## 2024-10-23 DIAGNOSIS — E440 Moderate protein-calorie malnutrition: Secondary | ICD-10-CM | POA: Diagnosis not present

## 2024-10-23 DIAGNOSIS — R6510 Systemic inflammatory response syndrome (SIRS) of non-infectious origin without acute organ dysfunction: Secondary | ICD-10-CM | POA: Diagnosis not present

## 2024-10-23 DIAGNOSIS — S22068D Other fracture of T7-T8 thoracic vertebra, subsequent encounter for fracture with routine healing: Secondary | ICD-10-CM | POA: Diagnosis not present

## 2024-10-23 DIAGNOSIS — S32029D Unspecified fracture of second lumbar vertebra, subsequent encounter for fracture with routine healing: Secondary | ICD-10-CM | POA: Diagnosis not present

## 2024-10-23 DIAGNOSIS — S32049D Unspecified fracture of fourth lumbar vertebra, subsequent encounter for fracture with routine healing: Secondary | ICD-10-CM | POA: Diagnosis not present

## 2024-10-23 DIAGNOSIS — M810 Age-related osteoporosis without current pathological fracture: Secondary | ICD-10-CM | POA: Diagnosis not present

## 2024-10-23 DIAGNOSIS — I10 Essential (primary) hypertension: Secondary | ICD-10-CM | POA: Diagnosis not present

## 2024-10-23 DIAGNOSIS — D539 Nutritional anemia, unspecified: Secondary | ICD-10-CM | POA: Diagnosis not present

## 2024-10-29 DIAGNOSIS — I10 Essential (primary) hypertension: Secondary | ICD-10-CM | POA: Diagnosis not present

## 2024-10-29 DIAGNOSIS — E440 Moderate protein-calorie malnutrition: Secondary | ICD-10-CM | POA: Diagnosis not present

## 2024-10-29 DIAGNOSIS — S32049D Unspecified fracture of fourth lumbar vertebra, subsequent encounter for fracture with routine healing: Secondary | ICD-10-CM | POA: Diagnosis not present

## 2024-10-29 DIAGNOSIS — M810 Age-related osteoporosis without current pathological fracture: Secondary | ICD-10-CM | POA: Diagnosis not present

## 2024-10-29 DIAGNOSIS — S32029D Unspecified fracture of second lumbar vertebra, subsequent encounter for fracture with routine healing: Secondary | ICD-10-CM | POA: Diagnosis not present

## 2024-10-29 DIAGNOSIS — D539 Nutritional anemia, unspecified: Secondary | ICD-10-CM | POA: Diagnosis not present

## 2024-10-29 DIAGNOSIS — R6510 Systemic inflammatory response syndrome (SIRS) of non-infectious origin without acute organ dysfunction: Secondary | ICD-10-CM | POA: Diagnosis not present

## 2024-10-29 DIAGNOSIS — S22068D Other fracture of T7-T8 thoracic vertebra, subsequent encounter for fracture with routine healing: Secondary | ICD-10-CM | POA: Diagnosis not present

## 2024-10-29 DIAGNOSIS — S32039D Unspecified fracture of third lumbar vertebra, subsequent encounter for fracture with routine healing: Secondary | ICD-10-CM | POA: Diagnosis not present

## 2024-11-02 DIAGNOSIS — M810 Age-related osteoporosis without current pathological fracture: Secondary | ICD-10-CM | POA: Diagnosis not present

## 2024-11-02 DIAGNOSIS — S32039D Unspecified fracture of third lumbar vertebra, subsequent encounter for fracture with routine healing: Secondary | ICD-10-CM | POA: Diagnosis not present

## 2024-11-02 DIAGNOSIS — S32049D Unspecified fracture of fourth lumbar vertebra, subsequent encounter for fracture with routine healing: Secondary | ICD-10-CM | POA: Diagnosis not present

## 2024-11-02 DIAGNOSIS — S32029D Unspecified fracture of second lumbar vertebra, subsequent encounter for fracture with routine healing: Secondary | ICD-10-CM | POA: Diagnosis not present

## 2024-11-02 DIAGNOSIS — I10 Essential (primary) hypertension: Secondary | ICD-10-CM | POA: Diagnosis not present

## 2024-11-02 DIAGNOSIS — R6510 Systemic inflammatory response syndrome (SIRS) of non-infectious origin without acute organ dysfunction: Secondary | ICD-10-CM | POA: Diagnosis not present

## 2024-11-02 DIAGNOSIS — D539 Nutritional anemia, unspecified: Secondary | ICD-10-CM | POA: Diagnosis not present

## 2024-11-02 DIAGNOSIS — E440 Moderate protein-calorie malnutrition: Secondary | ICD-10-CM | POA: Diagnosis not present

## 2024-11-02 DIAGNOSIS — S22068D Other fracture of T7-T8 thoracic vertebra, subsequent encounter for fracture with routine healing: Secondary | ICD-10-CM | POA: Diagnosis not present
